# Patient Record
Sex: Female | Born: 2000 | Race: Black or African American | Hispanic: No | Marital: Single | Smoking: Never smoker
Health system: Southern US, Community
[De-identification: ages and names within clinical notes are randomized; demographics above are authoritative.]

## PROBLEM LIST (undated history)

## (undated) DIAGNOSIS — J45909 Unspecified asthma, uncomplicated: Secondary | ICD-10-CM

## (undated) DIAGNOSIS — A749 Chlamydial infection, unspecified: Secondary | ICD-10-CM

## (undated) HISTORY — DX: Chlamydial infection, unspecified: A74.9

## (undated) HISTORY — PX: WISDOM TOOTH EXTRACTION: SHX21

---
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
## 2018-07-22 ENCOUNTER — Ambulatory Visit: Payer: Medicaid Other | Admitting: Obstetrics & Gynecology

## 2019-04-04 ENCOUNTER — Ambulatory Visit (HOSPITAL_COMMUNITY)
Admission: EM | Admit: 2019-04-04 | Discharge: 2019-04-04 | Disposition: A | Discharge status: Home / Self Care | Payer: Medicaid Other | Attending: Family Medicine | Admitting: Family Medicine

## 2019-04-04 ENCOUNTER — Encounter (HOSPITAL_COMMUNITY): Payer: Self-pay

## 2019-04-04 ENCOUNTER — Other Ambulatory Visit: Payer: Self-pay

## 2019-04-04 DIAGNOSIS — R300 Dysuria: Secondary | ICD-10-CM

## 2019-04-04 LAB — POCT URINALYSIS DIP (DEVICE)
Bilirubin Urine: NEGATIVE
Glucose, UA: NEGATIVE mg/dL
Hgb urine dipstick: NEGATIVE
Ketones, ur: NEGATIVE mg/dL
Leukocytes,Ua: NEGATIVE
Nitrite: NEGATIVE
Protein, ur: NEGATIVE mg/dL
Specific Gravity, Urine: 1.025 (ref 1.005–1.030)
Urobilinogen, UA: 1 mg/dL (ref 0.0–1.0)
pH: 7 (ref 5.0–8.0)

## 2019-04-04 MED ORDER — CEPHALEXIN 500 MG PO CAPS: 500.0000 mg | ORAL_CAPSULE | Freq: Two times a day (BID) | ORAL | 0 refills | Status: DC

## 2019-04-04 NOTE — ED Provider Notes (Signed)
[place]    CSN: [id number] Arrival date & time: 04/04/19  1057     History   Chief Complaint Chief Complaint  Patient presents with  . Urinary Tract Infection    HPI Susan Moore is a 18 y.o. female.   HPI  Healthy 18 year old.  Here for dysuria and frequency.  She thinks she has a urinary tract infection.  She has had this before.  She also has a slight vaginal discharge.  No odor.  No itching.  She does not feel she has a vaginal infection.  We discussed STD testing she agrees.  She does have a single partner for the last 3 to 4 months.  They do not always use condoms.  She is using birth control.  Past Medical History:  Diagnosis Date  . Asthma     There are no active problems to display for this patient.   History reviewed. No pertinent surgical history.  OB History   No obstetric history on file.      Home Medications    Prior to Admission medications   Medication Sig Start Date End Date Taking? Authorizing Provider  albuterol (PROVENTIL HFA;VENTOLIN HFA) 108 (90 Base) MCG/ACT inhaler Inhale 1-2 puffs into the lungs every 6 (six) hours as needed for wheezing or shortness of breath.    [provider]  albuterol (PROVENTIL) (2.5 MG/3ML) 0.083% nebulizer solution Take 2.5 mg by nebulization every 6 (six) hours as needed for wheezing.    [provider]  cephALEXin (KEFLEX) 500 MG capsule Take 1 capsule (500 mg total) by mouth 2 (two) times daily. 04/04/19   Raylene Everts, MD  etonogestrel (IMPLANON) 68 MG IMPL implant 1 each by Subdermal route once.    [provider]    Family History History reviewed. No pertinent family history.  Social History Social History   Tobacco Use  . Smoking status: Passive Smoke Exposure - Never Smoker  . Smokeless tobacco: Never Used  Substance Use Topics  . Alcohol use: No  . Drug use: No     Allergies   Patient has no known allergies.   Review of Systems Review of  Systems  Constitutional: Negative for chills and fever.  HENT: Negative for ear pain and sore throat.   Eyes: Negative for pain and visual disturbance.  Respiratory: Negative for cough and shortness of breath.   Cardiovascular: Negative for chest pain and palpitations.  Gastrointestinal: Negative for abdominal pain and vomiting.  Genitourinary: Positive for dysuria, frequency and vaginal discharge. Negative for hematuria.  Musculoskeletal: Negative for arthralgias and back pain.  Skin: Negative for color change and rash.  Neurological: Negative for seizures and syncope.  All other systems reviewed and are negative.    Physical Exam Triage Vital Signs ED Triage Vitals  Enc Vitals Group     BP 04/04/19 1115 104/65     Pulse Rate 04/04/19 1115 77     Resp 04/04/19 1115 16     Temp 04/04/19 1115 98.9 F (37.2 C)     Temp Source 04/04/19 1115 Oral     SpO2 04/04/19 1115 100 %     Weight 04/04/19 1114 135 lb (61.2 kg)     Height --      Head Circumference --      Peak Flow --      Pain Score 04/04/19 1114 3     Pain Loc --      Pain Edu? --  Excl. in GC? --    No data found.  Updated Vital Signs BP 104/65 (BP Location: Right Arm)   Pulse 77   Temp 98.9 F (37.2 C) (Oral)   Resp 16   Wt 61.2 kg   LMP 03/18/2019   SpO2 100%   BMI 23.91 kg/m      Physical Exam Constitutional:      General: She is not in acute distress.    Appearance: She is well-developed.  HENT:     Head: Normocephalic and atraumatic.  Eyes:     Conjunctiva/sclera: Conjunctivae normal.     Pupils: Pupils are equal, round, and reactive to light.  Neck:     Musculoskeletal: Normal range of motion.  Cardiovascular:     Rate and Rhythm: Normal rate.  Pulmonary:     Effort: Pulmonary effort is normal. No respiratory distress.  Abdominal:     General: There is no distension.     Palpations: Abdomen is soft.  Genitourinary:    Comments: Exam deferred Musculoskeletal: Normal range of motion.   Skin:    General: Skin is warm and dry.  Neurological:     Mental Status: She is alert.      UC Treatments / Results  Labs (all labs ordered are listed, but only abnormal results are displayed) Labs Reviewed  URINE CULTURE  POCT URINALYSIS DIP (DEVICE)  CERVICOVAGINAL ANCILLARY ONLY    EKG None  Radiology No results found.  Procedures Procedures (including critical care time)  Medications Ordered in UC Medications - No data to display  Initial Impression / Assessment and Plan / UC Course  I have reviewed the triage vital signs and the nursing notes.  Pertinent labs & imaging results that were available during my care of the patient were reviewed by me and considered in my medical decision making (see chart for details).     We will treat for cystitis.  Test for STDs.  Discussed safe sex Final Clinical Impressions(s) / UC Diagnoses   Final diagnoses:  Dysuria     Discharge Instructions     Take the antibiotic 2 times a day Take 2 doses today Drink lots of water.  We did lab testing during this visit.  If there are any abnormal findings that require change in medicine or indicate a positive result, you will be notified.  If all of your tests are normal, you will not be called.       ED Prescriptions    Medication Sig Dispense Auth. Provider   cephALEXin (KEFLEX) 500 MG capsule Take 1 capsule (500 mg total) by mouth 2 (two) times daily. 10 capsule Raylene Everts, MD     Controlled Substance Prescriptions [place] Controlled Substance Registry consulted? Not Applicable   Raylene Everts, MD 04/04/19 1218

## 2019-04-04 NOTE — ED Notes (Signed)
Patient able to ambulate independently  

## 2019-04-04 NOTE — Discharge Instructions (Signed)
Take the antibiotic 2 times a day Take 2 doses today Drink lots of water.  We did lab testing during this visit.  If there are any abnormal findings that require change in medicine or indicate a positive result, you will be notified.  If all of your tests are normal, you will not be called.

## 2019-04-04 NOTE — ED Triage Notes (Signed)
Pt states she has pressure when voiding. Pt states it burn when she voids. This has been going on for a week.

## 2019-04-05 LAB — URINE CULTURE: Culture: NO GROWTH

## 2019-04-06 ENCOUNTER — Telehealth (HOSPITAL_COMMUNITY): Payer: Self-pay | Admitting: Emergency Medicine

## 2019-04-06 LAB — CERVICOVAGINAL ANCILLARY ONLY
Chlamydia: NEGATIVE
Neisseria Gonorrhea: POSITIVE — AB
Trichomonas: NEGATIVE

## 2019-04-06 NOTE — Telephone Encounter (Signed)
Gonorrhea is positive.  Patient should return as soon as possible to the urgent care for treatment with IM rocephin 250mg  and po zithromax 1g. Patient will not need to see a provider unless there are new symptoms she would like evaluated. Pt needs education to refrain from sexual intercourse for now and for 7 days after treatment to give the medicine time to work. Sexual partners need to be notified and tested/treated. Condoms may reduce risk of reinfection. [facility] notified.   Attempted to reach patient. No answer at this time. Voicemail left.

## 2019-04-08 ENCOUNTER — Telehealth (HOSPITAL_COMMUNITY): Payer: Self-pay | Admitting: Emergency Medicine

## 2019-04-08 ENCOUNTER — Ambulatory Visit (HOSPITAL_COMMUNITY)
Admission: EM | Admit: 2019-04-08 | Discharge: 2019-04-08 | Disposition: A | Discharge status: Home / Self Care | Payer: Self-pay | Attending: Family Medicine | Admitting: Family Medicine

## 2019-04-08 DIAGNOSIS — Z202 Contact with and (suspected) exposure to infections with a predominantly sexual mode of transmission: Secondary | ICD-10-CM

## 2019-04-08 DIAGNOSIS — Z23 Encounter for immunization: Secondary | ICD-10-CM

## 2019-04-08 MED ORDER — CEFTRIAXONE SODIUM 250 MG IJ SOLR
INTRAMUSCULAR | Status: AC
  Filled 2019-04-08: qty 250

## 2019-04-08 MED ORDER — AZITHROMYCIN 250 MG PO TABS
1000.0000 mg | ORAL_TABLET | Freq: Once | ORAL | Status: AC
  Administered 2019-04-08: 1000 mg via ORAL

## 2019-04-08 MED ORDER — AZITHROMYCIN 250 MG PO TABS
ORAL_TABLET | ORAL | Status: AC
  Filled 2019-04-08: qty 4

## 2019-04-08 MED ORDER — CEFTRIAXONE SODIUM 250 MG IJ SOLR
250.0000 mg | Freq: Once | INTRAMUSCULAR | Status: AC
  Administered 2019-04-08: 250 mg via INTRAMUSCULAR

## 2019-04-08 NOTE — Telephone Encounter (Signed)
Patient contacted and made aware of all results, all questions answered. Pt states she will return for treatment today.

## 2019-04-08 NOTE — ED Notes (Signed)
Pt here for std treatment.  

## 2019-09-10 ENCOUNTER — Inpatient Hospital Stay (HOSPITAL_COMMUNITY)
Admission: AD | Admit: 2019-09-10 | Discharge: 2019-09-10 | Disposition: A | Discharge status: Home / Self Care | Payer: Medicaid Other | Attending: Obstetrics and Gynecology | Admitting: Obstetrics and Gynecology

## 2019-09-10 ENCOUNTER — Inpatient Hospital Stay (HOSPITAL_COMMUNITY): Payer: Medicaid Other

## 2019-09-10 ENCOUNTER — Encounter (HOSPITAL_COMMUNITY): Payer: Self-pay | Admitting: *Deleted

## 2019-09-10 ENCOUNTER — Other Ambulatory Visit: Payer: Self-pay

## 2019-09-10 DIAGNOSIS — R109 Unspecified abdominal pain: Secondary | ICD-10-CM | POA: Insufficient documentation

## 2019-09-10 DIAGNOSIS — J45909 Unspecified asthma, uncomplicated: Secondary | ICD-10-CM | POA: Insufficient documentation

## 2019-09-10 DIAGNOSIS — O26899 Other specified pregnancy related conditions, unspecified trimester: Secondary | ICD-10-CM

## 2019-09-10 DIAGNOSIS — O3680X0 Pregnancy with inconclusive fetal viability, not applicable or unspecified: Secondary | ICD-10-CM

## 2019-09-10 DIAGNOSIS — Z3A01 Less than 8 weeks gestation of pregnancy: Secondary | ICD-10-CM | POA: Insufficient documentation

## 2019-09-10 DIAGNOSIS — O99511 Diseases of the respiratory system complicating pregnancy, first trimester: Secondary | ICD-10-CM | POA: Insufficient documentation

## 2019-09-10 DIAGNOSIS — Z79899 Other long term (current) drug therapy: Secondary | ICD-10-CM | POA: Insufficient documentation

## 2019-09-10 DIAGNOSIS — O26891 Other specified pregnancy related conditions, first trimester: Secondary | ICD-10-CM | POA: Insufficient documentation

## 2019-09-10 DIAGNOSIS — Z369 Encounter for antenatal screening, unspecified: Secondary | ICD-10-CM | POA: Insufficient documentation

## 2019-09-10 LAB — CBC
HCT: 39.5 % (ref 36.0–46.0)
Hemoglobin: 13.3 g/dL (ref 12.0–15.0)
MCH: 29.2 pg (ref 26.0–34.0)
MCHC: 33.7 g/dL (ref 30.0–36.0)
MCV: 86.8 fL (ref 80.0–100.0)
Platelets: 209 10*3/uL (ref 150–400)
RBC: 4.55 MIL/uL (ref 3.87–5.11)
RDW: 13.2 % (ref 11.5–15.5)
WBC: 6.3 10*3/uL (ref 4.0–10.5)
nRBC: 0 % (ref 0.0–0.2)

## 2019-09-10 LAB — WET PREP, GENITAL
Sperm: NONE SEEN
Trich, Wet Prep: NONE SEEN
Yeast Wet Prep HPF POC: NONE SEEN

## 2019-09-10 LAB — URINALYSIS, ROUTINE W REFLEX MICROSCOPIC
Bilirubin Urine: NEGATIVE
Glucose, UA: NEGATIVE mg/dL
Hgb urine dipstick: NEGATIVE
Ketones, ur: NEGATIVE mg/dL
Leukocytes,Ua: NEGATIVE
Nitrite: NEGATIVE
Protein, ur: NEGATIVE mg/dL
Specific Gravity, Urine: 1.015 (ref 1.005–1.030)
pH: 8 (ref 5.0–8.0)

## 2019-09-10 LAB — POC URINE PREG, ED: Preg Test, Ur: POSITIVE — AB

## 2019-09-10 LAB — HCG, QUANTITATIVE, PREGNANCY: hCG, Beta Chain, Quant, S: 2884 m[IU]/mL — ABNORMAL HIGH (ref <5)

## 2019-09-10 NOTE — ED Notes (Signed)
Transport called to accompany pt to MAU, MAU called and agrees with plan for transport

## 2019-09-10 NOTE — MAU Note (Signed)
Pt transferred from [facility] for +pregnancy test and abd pain. Pt reports she had had lower abd pain on and off since Monday. Pain radiates toward her back at time as well.  Denies any vag discharge or bleeding.

## 2019-09-10 NOTE — ED Triage Notes (Signed)
States she is a day late for her period and she is having abd. Pain , denies n/v. Denies vaginal discharge

## 2019-09-10 NOTE — Discharge Instructions (Signed)
Abdominal Pain During Pregnancy ° °Belly (abdominal) pain is common during pregnancy. There are many possible causes. Most of the time, it is not a serious problem. Other times, it can be a sign that something is wrong with the pregnancy. Always tell your doctor if you have belly pain. °Follow these instructions at home: °· Do not have sex or put anything in your vagina until your pain goes away completely. °· Get plenty of rest until your pain gets better. °· Drink enough fluid to keep your pee (urine) pale yellow. °· Take over-the-counter and prescription medicines only as told by your doctor. °· Keep all follow-up visits as told by your doctor. This is important. °Contact a doctor if: °· Your pain continues or gets worse after resting. °· You have lower belly pain that: °? Comes and goes at regular times. °? Spreads to your back. °? Feels like menstrual cramps. °· You have pain or burning when you pee (urinate). °Get help right away if: °· You have a fever or chills. °· You have vaginal bleeding. °· You are leaking fluid from your vagina. °· You are passing tissue from your vagina. °· You throw up (vomit) for more than 24 hours. °· You have watery poop (diarrhea) for more than 24 hours. °· Your baby is moving less than usual. °· You feel very weak or faint. °· You have shortness of breath. °· You have very bad pain in your upper belly. °Summary °· Belly (abdominal) pain is common during pregnancy. There are many possible causes. °· If you have belly pain during pregnancy, tell your doctor right away. °· Keep all follow-up visits as told by your doctor. This is important. °This information is not intended to replace advice given to you by your health care provider. Make sure you discuss any questions you have with your health care provider. °Document Released: 10/16/2009 Document Revised: 02/15/2019 Document Reviewed: 01/30/2017 °Elsevier Patient Education © 2020 [employer] ° °

## 2019-09-10 NOTE — MAU Provider Note (Signed)
[facility]     CSN: [id number]  Arrival date and time: 09/10/19 1343   First Provider Initiated Contact with Patient 09/10/19 1708      Chief Complaint  Patient presents with  . Abdominal Pain   18 y.o. G1 @[redacted]w[redacted]d  by sure LMP presenting with LAP. Reports onset 4 days ago. Describes as intermittent and cramping. Pain wraps around to back a times. Rates 4/10. Has not tried anything for it. Denies VB or discharge. No urinary sx. Hx of constipation but had BM this am.    OB History    Gravida  1   Para      Term      Preterm      AB      Living        SAB      TAB      Ectopic      Multiple      Live Births              Past Medical History:  Diagnosis Date  . Asthma     History reviewed. No pertinent surgical history.  No family history on file.  Social History   Tobacco Use  . Smoking status: Passive Smoke Exposure - Never Smoker  . Smokeless tobacco: Never Used  Substance Use Topics  . Alcohol use: No  . Drug use: No    Allergies: No Known Allergies  Medications Prior to Admission  Medication Sig Dispense Refill Last Dose  . albuterol (PROVENTIL HFA;VENTOLIN HFA) 108 (90 Base) MCG/ACT inhaler Inhale 1-2 puffs into the lungs every 6 (six) hours as needed for wheezing or shortness of breath.   Past Week at Unknown time  . albuterol (PROVENTIL) (2.5 MG/3ML) 0.083% nebulizer solution Take 2.5 mg by nebulization every 6 (six) hours as needed for wheezing.     . cephALEXin (KEFLEX) 500 MG capsule Take 1 capsule (500 mg total) by mouth 2 (two) times daily. 10 capsule 0   . etonogestrel (IMPLANON) 68 MG IMPL implant 1 each by Subdermal route once.       Review of Systems  Constitutional: Negative for chills and fever.  Gastrointestinal: Positive for abdominal pain and nausea. Negative for constipation, diarrhea and vomiting.  Genitourinary: Negative for dysuria, frequency, hematuria, vaginal bleeding and vaginal discharge.  Musculoskeletal: Positive  for back pain.   Physical Exam   Blood pressure 125/69, pulse 100, temperature 98.4 F (36.9 C), resp. rate 18, height 5\' 3"  (1.6 m), weight 59.9 kg, last menstrual period 08/12/2019, SpO2 100 %.  Physical Exam  Nursing note and vitals reviewed. Constitutional: She is oriented to person, place, and time. She appears well-developed and well-nourished. No distress.  HENT:  Head: Normocephalic and atraumatic.  Neck: Normal range of motion.  Respiratory: Effort normal. No respiratory distress.  GI: Soft. She exhibits no distension and no mass. There is no abdominal tenderness. There is no rebound and no guarding.  Genitourinary:    Genitourinary Comments: External: no lesions or erythema Vagina: rugated, pink, moist, scant thin white discharge Uterus: non enlarged, anteverted, non tender, no CMT Adnexae: no masses, no tenderness left, no tenderness right Cervix closed/long    Musculoskeletal: Normal range of motion.  Neurological: She is alert and oriented to person, place, and time.   Results for orders placed or performed during the hospital encounter of 09/10/19 (from the past 24 hour(s))  Urinalysis, Routine w reflex microscopic     Status: None   Collection Time: 09/10/19  2:25 PM  Result Value Ref Range   Color, Urine YELLOW YELLOW   APPearance CLEAR CLEAR   Specific Gravity, Urine 1.015 1.005 - 1.030   pH 8.0 5.0 - 8.0   Glucose, UA NEGATIVE NEGATIVE mg/dL   Hgb urine dipstick NEGATIVE NEGATIVE   Bilirubin Urine NEGATIVE NEGATIVE   Ketones, ur NEGATIVE NEGATIVE mg/dL   Protein, ur NEGATIVE NEGATIVE mg/dL   Nitrite NEGATIVE NEGATIVE   Leukocytes,Ua NEGATIVE NEGATIVE  POC Urine Pregnancy, ED (not at [facility])     Status: Abnormal   Collection Time: 09/10/19  2:51 PM  Result Value Ref Range   Preg Test, Ur POSITIVE (A) NEGATIVE  CBC     Status: None   Collection Time: 09/10/19  5:15 PM  Result Value Ref Range   WBC 6.3 4.0 - 10.5 K/uL   RBC 4.55 3.87 - 5.11 MIL/uL    Hemoglobin 13.3 12.0 - 15.0 g/dL   HCT 39.5 36.0 - 46.0 %   MCV 86.8 80.0 - 100.0 fL   MCH 29.2 26.0 - 34.0 pg   MCHC 33.7 30.0 - 36.0 g/dL   RDW 13.2 11.5 - 15.5 %   Platelets 209 150 - 400 K/uL   nRBC 0.0 0.0 - 0.2 %  hCG, quantitative, pregnancy     Status: Abnormal   Collection Time: 09/10/19  5:15 PM  Result Value Ref Range   hCG, Beta Chain, Quant, S 2,884 (H) <5 mIU/mL  Wet prep, genital     Status: Abnormal   Collection Time: 09/10/19  5:22 PM   Specimen: PATH Cytology Cervicovaginal Ancillary Only  Result Value Ref Range   Yeast Wet Prep HPF POC NONE SEEN NONE SEEN   Trich, Wet Prep NONE SEEN NONE SEEN   Clue Cells Wet Prep HPF POC PRESENT (A) NONE SEEN   WBC, Wet Prep HPF POC FEW (A) NONE SEEN   Sperm NONE SEEN    US Ob Less Than 14 Weeks With Ob Transvaginal  Result Date: 09/10/2019 CLINICAL DATA:  Abdominal pain EXAM: OBSTETRIC <14 WK [place] AND TRANSVAGINAL OB US TECHNIQUE: Both transabdominal and transvaginal ultrasound examinations were performed for complete evaluation of the gestation as well as the maternal uterus, adnexal regions, and pelvic cul-de-sac. Transvaginal technique was performed to assess early pregnancy. COMPARISON:  None. FINDINGS: LMP: 08/12/2019 GA by LMP: 4 w  1 d EDC by LMP: 05/18/2020 Intrauterine gestational sac: Single Yolk sac:  Not Visualized. Embryo:  Not Visualized. Cardiac Activity: Not Visualized. MSD: 3.4 mm   5 w   0 d Subchorionic hemorrhage:  None visualized. Maternal uterus/adnexae: Corpus luteum with peripheral color flow seen in the right ovary. Ovaries are otherwise unremarkable. No visible uterine abnormality. No free fluid. IMPRESSION: Single intrauterine gestational sac with an estimated gestational age of [redacted] weeks 0 days by mean sac diameter. Nonvisualization of the yolk sac or embryo may be related to early dates. Recommend serial beta HCG and short-term interval follow-up ultrasound in 7-10 days to assess for normal progression.  Electronically Signed   By: Lovena Le M.D.   On: 09/10/2019 18:02   MAU Course  Procedures  MDM Labs and [place] ordered and reviewed. IUGS seen but no YS or FP, likely this is an early pregnancy but cannot exclude ectopic pregnancy or failed pregnancy-discussed with pt. Will follow quant after 48 hrs. Pt has U[facility] scheduled at [facility] but would like f/u at [facility] to cancel UPT appt. Stable for discharge home.  Assessment and Plan  1. Pregnancy, location unknown   2. Abdominal pain affecting pregnancy    Discharge home Follow up at [facility] on 11/2 @11 :00 am SAB/ectopic precautions  Allergies as of 09/10/2019   No Known Allergies     Medication List    STOP taking these medications   cephALEXin 500 MG capsule Commonly known as: KEFLEX   Implanon 68 MG Impl implant Generic drug: etonogestrel     TAKE these medications   albuterol (2.5 MG/3ML) 0.083% nebulizer solution Commonly known as: PROVENTIL Take 2.5 mg by nebulization every 6 (six) hours as needed for wheezing.   albuterol 108 (90 Base) MCG/ACT inhaler Commonly known as: VENTOLIN HFA Inhale 1-2 puffs into the lungs every 6 (six) hours as needed for wheezing or shortness of breath.      Julianne Handler, CNM 09/10/2019, 6:23 PM

## 2019-09-10 NOTE — ED Notes (Signed)
Pt states that her period should have started 09/07/19, is now 3 days late.

## 2019-09-13 ENCOUNTER — Other Ambulatory Visit: Payer: Self-pay

## 2019-09-13 ENCOUNTER — Ambulatory Visit (INDEPENDENT_AMBULATORY_CARE_PROVIDER_SITE_OTHER): Payer: Self-pay | Admitting: Emergency Medicine

## 2019-09-13 DIAGNOSIS — O3680X0 Pregnancy with inconclusive fetal viability, not applicable or unspecified: Secondary | ICD-10-CM

## 2019-09-13 LAB — BETA HCG QUANT (REF LAB): hCG Quant: 6690 m[IU]/mL

## 2019-09-13 LAB — GC/CHLAMYDIA PROBE AMP (~~LOC~~) NOT AT ARMC
Chlamydia: NEGATIVE
Comment: NEGATIVE
Comment: NORMAL
Neisseria Gonorrhea: NEGATIVE

## 2019-09-13 NOTE — Progress Notes (Signed)
Pt here today for a stat beta lab draw. Pt denies having pain or bleeding. Pt informed that results could take at least 2 hours and once received they would be reviewed with a provider to establish a plan of care and she would be called. Pt was instructed to be seen in MAU for bleeding soaking a pad in an hour or less and severe pain. Pt verbalized understanding.   HCG result 6,690 today. Results reviewed with Rip Harbour, MD and orders were placed for ultrasound.   Pt called and informed of plan of care and given ultrasound date and time of 11/12 @2pm . Pt instructed to arrive 15 minutes prior with a full bladder. Pt verbalized understanding and had no further questions or concerns.

## 2019-09-13 NOTE — Progress Notes (Signed)
Agree with A & P. 

## 2019-09-23 ENCOUNTER — Ambulatory Visit (INDEPENDENT_AMBULATORY_CARE_PROVIDER_SITE_OTHER): Payer: Medicaid Other | Admitting: Lactation Services

## 2019-09-23 ENCOUNTER — Ambulatory Visit (HOSPITAL_COMMUNITY)
Admission: RE | Admit: 2019-09-23 | Discharge: 2019-09-23 | Disposition: A | Discharge status: Home / Self Care | Payer: Medicaid Other | Source: Ambulatory Visit | Attending: Obstetrics and Gynecology | Admitting: Obstetrics and Gynecology

## 2019-09-23 ENCOUNTER — Other Ambulatory Visit: Payer: Self-pay

## 2019-09-23 DIAGNOSIS — O3680X0 Pregnancy with inconclusive fetal viability, not applicable or unspecified: Secondary | ICD-10-CM | POA: Diagnosis not present

## 2019-09-23 DIAGNOSIS — Z3A01 Less than 8 weeks gestation of pregnancy: Secondary | ICD-10-CM | POA: Diagnosis not present

## 2019-09-23 DIAGNOSIS — Z3689 Encounter for other specified antenatal screening: Secondary | ICD-10-CM | POA: Diagnosis not present

## 2019-09-23 DIAGNOSIS — Z3401 Encounter for supervision of normal first pregnancy, first trimester: Secondary | ICD-10-CM

## 2019-09-23 MED ORDER — PRENATAL PLUS 27-1 MG PO TABS: 1.0000 | ORAL_TABLET | Freq: Every day | ORAL | 11 refills | Status: DC

## 2019-09-23 NOTE — Progress Notes (Signed)
Pt here for [place] results. EDD 05/15/2020 via [place]. Pt is unsure of LMP.   Pt denies pain, cramping or spotting.   Pt is not taking PNV, PNV sent to pharmacy.   List of approved OTC meds given.   Pt to follow up Nurse intake and provider visit.   Pt informed of when to seek emergency care at the [facility] and [place] if needed.

## 2019-09-26 NOTE — Progress Notes (Signed)
I agree with the nurses note and plan of care.   Lezlie Lye, NP 09/26/2019 12:57 AM

## 2019-10-26 ENCOUNTER — Ambulatory Visit (INDEPENDENT_AMBULATORY_CARE_PROVIDER_SITE_OTHER): Payer: Medicaid Other | Admitting: *Deleted

## 2019-10-26 ENCOUNTER — Other Ambulatory Visit: Payer: Self-pay

## 2019-10-26 DIAGNOSIS — Z3490 Encounter for supervision of normal pregnancy, unspecified, unspecified trimester: Secondary | ICD-10-CM

## 2019-10-26 DIAGNOSIS — O0990 Supervision of high risk pregnancy, unspecified, unspecified trimester: Secondary | ICD-10-CM | POA: Insufficient documentation

## 2019-10-26 MED ORDER — BLOOD PRESSURE KIT DEVI: 1.0000 | 0 refills | Status: DC | PRN

## 2019-10-26 NOTE — Patient Instructions (Signed)

## 2019-10-26 NOTE — Progress Notes (Signed)
Explained I am completing her New OB Intake today. We discussed Her EDD and that it is based on  sure LMP . I reviewed her allergies, meds, OB History, Medical /Surgical history, and appropriate screenings. I explained I will send her the [employer] app- app sent to her while on phone.  I explained we will send a blood pressure cuff to [facility] that will fill that prescription and they  will call her to verify her information. I asked her to bring the blood pressure cuff with her to her first ob appointment so we can show her how to use it. Explained  then we will have her take her blood pressure weekly and enter into the app. Explained she will have some visits in office and some virtually.We discussed her new ob will be virtual and she would like to come in soon for ob labwork including panorama. I sent her  MyChart text and she signed up for Mychart and downloaded the MyChart app. I reviewed her appointment date/ time with her , our location and to wear mask, no visitors. Explained she will have virtual tomorrow and then at first ob visit in office will have exam and will be called with appointment for  ob bloodwork, hemoglobin a1C, cbg , genetic testing if desired, I scheduled an [place] at 19 weeks and gave her the appointment. She voices understanding.

## 2019-10-26 NOTE — Progress Notes (Signed)
I connected with  [name] on 10/26/19 at  9:30 AM EST by telephone and verified that I am speaking with the correct person using two identifiers.   I discussed the limitations, risks, security and privacy concerns of performing an evaluation and management service by telephone and the availability of in person appointments. I also discussed with the patient that there may be a patient responsible charge related to this service. The patient expressed understanding and agreed to proceed. Dating Criteria: Methods for Estimating the Due Date    Lakesha Levinson,RN 10/26/2019  9:31 AM

## 2019-10-26 NOTE — Progress Notes (Signed)
Patient seen and assessed by nursing staff during this encounter. I have reviewed the chart and agree with the documentation and plan.  Kerry Hough, PA-C 10/26/2019 11:16 AM

## 2019-10-27 ENCOUNTER — Other Ambulatory Visit (HOSPITAL_COMMUNITY)
Admission: RE | Admit: 2019-10-27 | Discharge: 2019-10-27 | Disposition: A | Discharge status: Home / Self Care | Payer: Medicaid Other | Source: Ambulatory Visit | Attending: Medical | Admitting: Medical

## 2019-10-27 ENCOUNTER — Encounter: Payer: Self-pay | Admitting: *Deleted

## 2019-10-27 ENCOUNTER — Ambulatory Visit (INDEPENDENT_AMBULATORY_CARE_PROVIDER_SITE_OTHER): Payer: Medicaid Other | Admitting: Medical

## 2019-10-27 VITALS — BP 111/69 | HR 91 | Wt 134.6 lb

## 2019-10-27 DIAGNOSIS — J4520 Mild intermittent asthma, uncomplicated: Secondary | ICD-10-CM | POA: Diagnosis not present

## 2019-10-27 DIAGNOSIS — J45909 Unspecified asthma, uncomplicated: Secondary | ICD-10-CM | POA: Insufficient documentation

## 2019-10-27 DIAGNOSIS — Z23 Encounter for immunization: Secondary | ICD-10-CM

## 2019-10-27 DIAGNOSIS — Z3A10 10 weeks gestation of pregnancy: Secondary | ICD-10-CM | POA: Diagnosis not present

## 2019-10-27 DIAGNOSIS — Z3490 Encounter for supervision of normal pregnancy, unspecified, unspecified trimester: Secondary | ICD-10-CM

## 2019-10-27 DIAGNOSIS — Z315 Encounter for genetic counseling: Secondary | ICD-10-CM | POA: Diagnosis not present

## 2019-10-27 DIAGNOSIS — O26891 Other specified pregnancy related conditions, first trimester: Secondary | ICD-10-CM | POA: Diagnosis not present

## 2019-10-27 DIAGNOSIS — Z3481 Encounter for supervision of other normal pregnancy, first trimester: Secondary | ICD-10-CM | POA: Diagnosis not present

## 2019-10-27 NOTE — Patient Instructions (Addendum)
Safe Medications in Pregnancy   Acne: Benzoyl Peroxide Salicylic Acid  Backache/Headache: Tylenol: 2 regular strength every 4 hours OR              2 Extra strength every 6 hours  Colds/Coughs/Allergies: Benadryl (alcohol free) 25 mg every 6 hours as needed Breath right strips Claritin Cepacol throat lozenges Chloraseptic throat spray Cold-Eeze- up to three times per day Cough drops, alcohol free Flonase (by prescription only) Guaifenesin Mucinex Robitussin DM (plain only, alcohol free) Saline nasal spray/drops Sudafed (pseudoephedrine) & Actifed ** use only after [redacted] weeks gestation and if you do not have high blood pressure Tylenol Vicks Vaporub Zinc lozenges Zyrtec   Constipation: Colace Ducolax suppositories Fleet enema Glycerin suppositories Metamucil Milk of magnesia Miralax Senokot Smooth move tea  Diarrhea: Kaopectate Imodium A-D  *NO pepto Bismol  Hemorrhoids: Anusol Anusol HC Preparation H Tucks  Indigestion: Tums Maalox Mylanta Zantac  Pepcid  Insomnia: Benadryl (alcohol free) 25mg every 6 hours as needed Tylenol PM Unisom, no Gelcaps  Leg Cramps: Tums MagGel  Nausea/Vomiting:  Bonine Dramamine Emetrol Ginger extract Sea bands Meclizine  Nausea medication to take during pregnancy:  Unisom (doxylamine succinate 25 mg tablets) Take one tablet daily at bedtime. If symptoms are not adequately controlled, the dose can be increased to a maximum recommended dose of two tablets daily (1/2 tablet in the morning, 1/2 tablet mid-afternoon and one at bedtime). Vitamin B6 100mg tablets. Take one tablet twice a day (up to 200 mg per day).  Skin Rashes: Aveeno products Benadryl cream or 25mg every 6 hours as needed Calamine Lotion 1% cortisone cream  Yeast infection: Gyne-lotrimin 7 Monistat 7   **If taking multiple medications, please check labels to avoid duplicating the same active ingredients **take medication as directed on  the label ** Do not exceed 4000 mg of tylenol in 24 hours **Do not take medications that contain aspirin or ibuprofen   Childbirth Education Options: [facility] Classes:  Childbirth education classes can help you get ready for a positive parenting experience. You can also meet other expectant parents and get free stuff for your baby. Each class runs for five weeks on the same night and costs $45 for the mother-to-be and her support person. Medicaid covers the cost if you are eligible. Call [phone] to register. [facility] Childbirth Education:  [phone] or [phone] or [email or web address]  Baby & Me Class: Discuss newborn & infant parenting and family adjustment issues with other new mothers in a relaxed environment. Each week brings a new speaker or baby-centered activity. We encourage new mothers to join us every Thursday at 11:00am. Babies birth until crawling. No registration or fee. Daddy Boot Camp: This course offers Dads-to-be the tools and knowledge needed to feel confident on their journey to becoming new fathers. Experienced dads, who have been trained as coaches, teach dads-to-be how to hold, comfort, diaper, swaddle and play with their infant while being able to support the new mom as well. A class for men taught by men. $25/dad Big Brother/Big Sister: Let your children share in the joy of a new brother or sister in this special class designed just for them. Class includes discussion about how families care for babies: swaddling, holding, diapering, safety as well as how they can be helpful in their new role. This class is designed for children ages 2 to 6, but any age is welcome. Please register each child individually. $5/child  Mom Talk: This mom-led group offers support and connection to   mothers as they journey through the adjustments and struggles of that sometimes overwhelming first year after the birth of a child. Tuesdays at 10:00am and  Thursdays at 6:00pm. Babies welcome. No registration or fee. Breastfeeding Support Group: This group is a mother-to-mother support circle where moms have the opportunity to share their breastfeeding experiences. A Lactation Consultant is present for questions and concerns. Meets each Tuesday at 11:00am. No fee or registration. Breastfeeding Your Baby: Learn what to expect in the first days of breastfeeding your newborn.  This class will help you feel more confident with the skills needed to begin your breastfeeding experience. Many new mothers are concerned about breastfeeding after leaving the hospital. This class will also address the most common fears and challenges about breastfeeding during the first few weeks, months and beyond. (call for fee) Comfort Techniques and Tour: This 2 hour interactive class will provide you the opportunity to learn & practice hands-on techniques that can help relieve some of the discomfort of labor and encourage your baby to rotate toward the best position for birth. You and your partner will be able to try a variety of labor positions with birth balls and rebozos as well as practice breathing, relaxation, and visualization techniques. A tour of the [facility] is included with this class. $20 per registrant and support person Childbirth Class- Weekend Option: This class is a Weekend version of our Birth & Baby series. It is designed for parents who have a difficult time fitting several weeks of classes into their schedule. It covers the care of your newborn and the basics of labor and childbirth. It also includes a Maternity Care Center Tour of [facility] and lunch. The class is held two consecutive days: beginning on Friday evening from 6:30 - 8:30 p.m. and the next day, Saturday from 9 a.m. - 4 p.m. (call for fee) Waterbirth Class: Interested in a waterbirth?  This informational class will help you discover whether waterbirth is the right fit  for you. Education about waterbirth itself, supplies you would need and how to assemble your support team is what you can expect from this class. Some obstetrical practices require this class in order to pursue a waterbirth. (Not all obstetrical practices offer waterbirth-check with your healthcare provider.) Register only the expectant mom, but you are encouraged to bring your partner to class! Required if planning waterbirth, no fee. Infant/Child CPR: Parents, grandparents, babysitters, and friends learn Cardio-Pulmonary Resuscitation skills for infants and children. You will also learn how to treat both conscious and unconscious choking in infants and children. This Family & Friends program does not offer certification. Register each participant individually to ensure that enough mannequins are available. (Call for fee) Grandparent Love: Expecting a grandbaby? This class is for you! Learn about the latest infant care and safety recommendations and ways to support your own child as he or she transitions into the parenting role. Taught by Registered Nurses who are childbirth instructors, but most importantly...they are grandmothers too! $10/person. Childbirth Class- Natural Childbirth: This series of 5 weekly classes is for expectant parents who want to learn and practice natural methods of coping with the process of labor and childbirth. Relaxation, breathing, massage, visualization, role of the partner, and helpful positioning are highlighted. Participants learn how to be confident in their body's ability to give birth. This class will empower and help parents make informed decisions about their own care. Includes discussion that will help new parents transition into the immediate postpartum period. Maternity   want to learn and practice natural methods of coping with the process of labor and childbirth. Relaxation, breathing, massage, visualization, role of the partner, and helpful positioning are highlighted. Participants learn how to be confident in their body's ability to give birth. This class will empower and help parents make informed decisions about their own care. Includes discussion that will help new parents transition into the immediate postpartum period. [facility] is included. We suggest taking this class between 25-32 weeks, but  it's only a recommendation. $75 per registrant and one support person or $30 Medicaid. Childbirth Class- 3 week Series: This option of 3 weekly classes helps you and your labor partner prepare for childbirth. Newborn care, labor & birth, cesarean birth, pain management, and comfort techniques are discussed and a [place] [facility] is included. The class meets at the same time, on the same day of the week for 3 consecutive weeks beginning with the starting date you choose. $60 for registrant and one support person.  Marvelous Multiples: Expecting twins, triplets, or more? This class covers the differences in labor, birth, parenting, and breastfeeding issues that face multiples' parents. NICU tour is included. Led by a Certified Childbirth Educator who is the mother of twins. No fee. Caring for Baby: This class is for expectant and adoptive parents who want to learn and practice the most up-to-date newborn care for their babies. Focus is on birth through the first six weeks of life. Topics include feeding, bathing, diapering, crying, umbilical cord care, circumcision care and safe sleep. Parents learn to recognize symptoms of illness and when to call the pediatrician. Register only the mom-to-be and your partner or support person can plan to come with you! $10 per registrant and support person Childbirth Class- online option: This online class offers you the freedom to complete a Birth and Baby series in the comfort of your own home. The flexibility of this option allows you to review sections at your own pace, at times convenient to you and your support people. It includes additional video information, animations, quizzes, and extended activities. Get organized with helpful eClass tools, checklists, and trackers. Once you register online for the class, you will receive an email within a few days to accept the invitation and begin the class when the time is right for you. The content  will be available to you for 60 days. $60 for 60 days of online access for you and your support people.  Local Doulas: Natural Baby Doulas [email or web address] Tel: [phone] [email or web address]/ [employer] [phone] [email or web address] .[email or web address] www.piedmontdoulas.com The Labor Hassell Halim  (also do waterbirth tub rental) [phone] [email or web address] https://www.[email or web address]/ Triad Birth Doula [phone] [email or web address] NotebookDistributors.fi Sacred Rhythms  [phone] https://sacred-rhythms.com/ [employer] (PADA) pada.[email or web address] https://www.frey.org/ [place] Birth and Baby  http://[email or web address]/ Considering Waterbirth? Guide for patients at [facility] [employer]  Why consider waterbirth?  . Gentle birth for babies . Less pain medicine used in labor . May allow for passive descent/less pushing . May reduce perineal tears  . More mobility and instinctive maternal position changes . Increased maternal relaxation . Reduced blood pressure in labor  Is waterbirth safe? What are the risks of infection, drowning or other complications?  . Infection: o Very low risk (3.7 % for tub vs 4.8% for bed) o 7 in 8000 waterbirths with documented infection o Poorly cleaned equipment most common cause o Slightly lower group B strep transmission rate  . Drowning o Maternal:  - Very low risk   -  Related to seizures or fainting o Newborn:  - Very low risk. No evidence of increased risk of respiratory problems in multiple large studies - Physiological protection from breathing under water - Avoid underwater birth if there are any fetal complications - Once baby's head is out of the water, keep it out.  . Birth complication o Some reports of cord trauma, but risk decreased by bringing baby to surface gradually o No evidence of increased risk of shoulder  dystocia. Mothers can usually change positions faster in water than in a bed, possibly aiding the maneuvers to free the shoulder.   You must attend a Doren Custard class at [facility]  3rd Wednesday of every month from 7-9pm  [employer] by calling [phone] or online at [email or web address]  Bring [place] the certificate from the class to your prenatal appointment  Meet with a midwife at 36 weeks to see if you can still plan a waterbirth and to sign the consent.   Purchase or rent the following supplies:   Water Birth Pool (Birth Pool in a Box or [place] for instance)  (Tubs start ~$125)  Single-use disposable tub liner designed for your brand of tub  New garden hose labeled "lead-free", "suitable for drinking water",  Electric drain pump to remove water (We recommend 792 gallon per hour or greater pump.)   Separate garden hose to remove the dirty water  Fish net  Bathing suit top (optional)  Long-handled mirror (optional)  Places to purchase or rent supplies  GotWebTools.is for tub purchases and supplies  Waterbirthsolutions.com for tub purchases and supplies  The Labor Ladies (www.thelaborladies.com) $275 for tub rental/set-up & take down/kit   [employer] (http://www.fleming.com/.htm) Information regarding doulas (labor support) who provide pool rentals  Our practice has a Birth Pool in a Box tub at the hospital that you may borrow on a first-come-first-served basis. It is your responsibility to to set up, clean and break down the tub. We cannot guarantee the availability of this tub in advance. You are responsible for bringing all accessories listed above. If you do not have all necessary supplies you cannot have a waterbirth.    Things that would prevent you from having a waterbirth:  Premature, <37wks  Previous cesarean birth  Presence of thick meconium-stained fluid  Multiple gestation (Twins, triplets,  etc.)  Uncontrolled diabetes or gestational diabetes requiring medication  Hypertension requiring medication or diagnosis of pre-eclampsia  Heavy vaginal bleeding  Non-reassuring fetal heart rate  Active infection (MRSA, etc.). Group B Strep is NOT a contraindication for  waterbirth.  If your labor has to be induced and induction method requires continuous  monitoring of the baby's heart rate  Other risks/issues identified by your obstetrical provider  Please remember that birth is unpredictable. Under certain unforeseeable circumstances your provider may advise against giving birth in the tub. These decisions will be made on a case-by-case basis and with the safety of you and your baby as our highest priority.   AREA PEDIATRIC/FAMILY PRACTICE PHYSICIANS  [place] [phone]) . [facility] Davy Pique, MD; Gwendlyn Deutscher, MD; Walker Kehr, MD; Andria Frames, MD; McDiarmid, MD; Dutch Quint, MD; Nori Riis, MD; [place]ingo Amber, [address] [place], [place] [address] o [phone] o Mon-Fri 8:30-12:30, 1:30-5:00 o Providers come to see babies at [facility] o Accepting Medicaid . [place] [facility] providers who accept newborns: Dorthy Cooler, MD; Orland Mustard, MD; Stephanie Acre, MD o [place], [place], [place] [address] o [phone]  o Mon-Fri 8:00-5:30 o Babies seen by providers at [facility] o Does NOT accept Medicaid o Please call early in hospitalization for appointment (limited availability)  . Mustard Rafter J Ranch, MD o [address], [place], [place] [address] o [phone] o Mon, Tue, Thur, Fri 8:30-5:00, Wed 10:00-7:00 (closed 1-2pm) o Babies seen by [facility] providers o Accepting Medicaid . Jerome, MD o [place], [place], [place] [address] o [phone] o Mon-Fri 8:30-5:00, Sat 8:30-12:00 o Provider comes to see babies at [place] Medicaid o Must have been referred from current patients or contacted office prior to delivery . [place] ([place] for [place]) Franne Forts, MD; Tamera Punt, MD; Doneen Poisson, MD; Fatima Sanger, MD; Wynetta Emery, MD; Jess Barters, MD; Tami Ribas, MD; Herbert Moors, MD; Derrell Lolling, MD; Dorothyann Peng, MD; Lucious Groves, NP; Baldo Ash, NP o [address], [place], [place] [address] o [phone] o Mon, Tue, Thur, Fri 8:30-5:30, Wed 9:30-5:30, Sat 8:30-12:30 o Babies seen by [facility] providers o Accepting Medicaid o Only accepting infants of first-time parents or siblings of current patients [facility] discharge coordinator will make follow-up appointment . Baltazar Najjar o [place] [address], [place], [place]  [address] o [phone]   Fax - [phone] . [facility] o [address], [place], [place]  [address] o Phone - [phone]   Fax [phone] . [place], [place], [place], [place]  [address] o [phone]  [place] [phone]) . [facility] Reginal Lutes, MD; Jacklynn Ganong, MD; Torrie Mayers, MD; MD; Rosana Hoes, MD; Servando Salina, MD; Rose Fillers, MD; Rex Kras, MD; Corinna Capra, MD; Volney American, MD; Trilby Drummer, MD; Janann Colonel, MD; Jimmye Norman, [place], [place], [place] [address] o [phone] o Mon-Fri 8:30-5:00 (extended evenings Mon-Thur as needed), Sat-Sun 10:00-1:00 o Providers come to see babies at [place] Medicaid for families of first-time babies and families with all children in the household age 74 and under. Must register with office prior to making appointment (M-F only). . [place], NP; Tomi Bamberger, MD; Redmond School, MD; [place], [place] [address] [place], [place] [address] o [phone] o Mon-Fri 8:00-5:00 o Babies seen by providers at [facility] o Does NOT accept Medicaid/Commercial Insurance Only . [facility] at [place]  ([facility])  Marnee Guarneri, MD; Drema Dallas, MD; Montine Circle, MD; Vilma Prader, MD; Vanita Panda, MD; Alfonso Ramus, MD; Ruthann Cancer, MD; Roxanne Mins, MD; Rosalva Ferron, MD; Polly Cobia, MD o [address] [place], [place] [address] o [phone] o Mon-Fri 8:30-5:30, Sat (Oct.-Mar.) 9:00-1:00 o Babies seen by providers at [place] [phone]) . [facility] Peers, MD; Suzan Slick, MD o [address], [place], [place] [address] o [phone] o Mon-Fri 8:30-5:00, Sat 8:30-12:00 o Providers come to see babies at [facility] o Does NOT accept Medicaid . [place] at [place], [place]; [place], MD; [place], [place]; Nancy Fetter, MD; Moreen Fowler, [place], [place], [place] [address] o [phone] o Mon-Fri 8:00-5:00 o Babies seen by providers at [facility] o Does NOT accept Medicaid o Only accepting babies of parents who are patients o Please call early in hospitalization for appointment (limited availability) . [facility] Pediatricians Blanca Friend, MD; Sharlene Motts, MD; Rod Can, MD; Warner Mccreedy, NP; Sabra Heck, MD; Ermalinda Memos, MD; Sharlett Iles, NP; Aurther Loft, MD; Jerrye Beavers, MD; Marcello Moores, MD; Berline Lopes, MD; Charolette Forward, MD o  [place], [place], [place] [address] o [phone] o Mon-Fri 8:00-5:00, Sat 9:00-12:00 o Providers come to see babies at [facility] o Does NOT accept [facility] [phone]) . [facility] providers accepting new patients: Dayna Ramus, NP; [place], [place], [place], [place] [address] o [phone] o Mon-Fri 8:00-5:00 o Babies seen by providers at [facility] o Does NOT accept Medicaid o Only accepting babies of parents who are patients o Please call early in hospitalization for appointment (limited availability) . [facility] Oswaldo Conroy, MD; Sheran Lawless, MD o [address] [place], [place] [address] o [phone] (press 1 to schedule  appointment) o Mon-Fri 8:00-5:00 o Providers come to see babies at [facility] o Does NOT accept Medicaid . [facility] Jodi Mourning, MD o [address], [place], [place] [address] o [phone] o Mon-Fri 8:30-5:00 (lunch 12:30-1:00), extended hours by appointment only Wed 5:00-6:30 o Babies seen by [facility] providers o Accepting Medicaid . [place] at Evalyn Casco, MD; [place], MD; Ethlyn Gallery, MD o [place], [place], [place] [address] o [phone] o Mon-Fri 8:00-5:00 o Babies seen by [facility] providers o Does NOT accept Medicaid . Therapist, music at [place], MD; Yong Channel, MD; [place], [place] [address] [place], [place] [address] o [phone] o Mon-Fri 8:00-5:00 o Babies seen by [facility] providers o Does NOT accept Medicaid . [place], [place]; [place], [place]; [place], NP; Albertina Parr, MD; Frederic Jericho, MD; Ronney Lion, MD; Carlos Levering, NP; Jerelene Redden, NP; Tomasita Crumble, NP; Ronelle Nigh, NP; Corinna Lines, MD; McFarland, MD o [address] [place], [place] [address] o [phone] o Mon-Fri 8:30-5:00, Sat 10:00-1:00 o Providers come to see babies at [facility] o Does NOT accept Medicaid o Free prenatal information session Tuesdays at 4:45pm . [facility] Porfirio Oar, MD; [place], [place]; [place], [place]; [place], [place] [place] [address] o [phone] o Mon-Fri 7:30-5:30 o Babies seen by [facility] providers . [facility] o [address] [place], [place], [place]  [address] o [phone]   Fax - [phone]  [place] [phone] & [phone]) . [place] o [address] [place], [place] [address] o [phone] o Mon-Thur 8:00-6:00 o Providers come to see babies at [place] Medicaid . Lucas, NP; Melford Aase, MD; [place], [place]; [place], [address] [place], [place] [address] o [phone] o Mon-Thur 7:30-7:30, Fri 7:30-4:30 o Babies seen by [facility] providers o Accepting Medicaid . [facility] Nyra Jabs, MD; Cristino Martes, NP; Gertie Baron, MD o [place] [address], [place], [place] [address] o [phone] o Mon-Fri 8:30-5:00, Sat 8:30-12:00 o Providers come to see babies at [place] Medicaid o Must have "Meet & Greet" appointment at office prior to delivery . [place] ([place]) Jodene Nam, MD; Juleen China, MD; Clydene Laming, [place] [address], [place], [place] [address] o [phone] o Mon-Wed 8:00-6:00, Thur-Fri 8:00-5:00, Sat 9:00-12:00 o Providers come to see babies at [facility] o Does NOT accept Medicaid o Only accepting siblings of current patients . [facility] [place], [place], [place], [place]  [address] o [phone]   Fax [phone] . [place] at [place] [address], [place],   [address] o  [phone]   Fax - [place] [phone] & [phone]) . Therapist, music at [place], DO; [place], [address] [place], [place] [address] o [phone] o Mon-Fri 7:00-5:00 o Babies seen by [facility] providers o Does NOT accept Medicaid . Archer, MD; [place], [place]; [place], [place], [place], [place] [address] o [phone] o Mon-Fri 8:00-5:00 o Babies seen by [facility] providers o Accepting Medicaid . Duncan, MD; [place], [place]; [place], NP; [place], [place], [place], [place] [address] o [phone] o Mon-Fri 8:00-5:00 o Babies seen by providers at [place] [phone]) . [facility] at [place], [place] o [address] [place], [place] [address] o [phone] o Mon-Fri 8:00-5:00 o Babies seen by [facility] providers o Does NOT accept Medicaid o Limited availability, please call early in hospitalization to schedule follow-up . [facility] Leilani Merl, PA; Maisie Fus, MD; [place], MD; [place], [place]; Jeannine Kitten, MD; [place], [facility] [address], [place], [place] [address] o [phone] o Mon-Fri 8:30-5:00, Sat 9:00-12:00 o Babies seen by providers at [facility] o Accepting Medicaid o Please register online then schedule online or call office o [email or web address] . [place] ([facility] [place]) Kristian Covey, NP; Dwyane Dee, MD; Leonidas Romberg, PA o [address] [place], [place], [place] [address] o [phone] o Mon-Fri 8:00-5:00 o Babies seen by providers at [facility] o Accepting Medicaid . [place] ([facility] [employer]) Dairl Ponder, MD; Rayvon Char, NP; Melina Modena, MD o [address] [place], [place], [place] [address] o [phone] o Mon-Fri 8:00-5:30, Sat&Sun by appointment (phones open at 8:30) o Babies seen by [facility] providers o Accepting Medicaid o Must be a first-time baby or sibling of current patient . [address], [address], [place], [place]  [address] o [phone]   Fax - [phone]  [place] [phone] & [phone]) . [place], [place]; [place], [place]; Benjamine Mola, MD; [place], [place]; Harrell Lark, MD o [address], [place], [place] [address] o [phone] o Mon-Thur 8:00-7:00, Fri 8:00-5:00, Sat 8:00-12:00, Sun 9:00-12:00 o Babies seen by [facility] providers o Accepting Medicaid . [facility], MD; Ruthann Cancer, MD; [facility], MD o [address], [place],   [address] o [phone] o Mon-Thur 8:00-5:00 o Babies seen by providers at [facility] o Accepting Medicaid . [facility] [place], MD; Coe[place]Goins, MD; Amedeo Plenty, MD; Bobby Rumpf, MD; List, MD; Lavonia Drafts, MD; Ruthann Cancer, MD; Selinda Eon, MD; Audie Box, MD; Jim Like, MD; Christie Nottingham, MD; Hubbard Hartshorn, MD; Modena Nunnery, MD o [address] [place], [place] [address] o [phone] o Mon-Fri 8:00-5:30, Sat (Oct.-Mar.) 9:00-1:00 o Babies seen by providers at [facility] o Accepting Medicaid o Must fill out new patient packet, available online at [email or web address] . [place] ([facility]) Barnabas Lister, NP; Kenton Kingfisher, NP; Claiborne Billings, NP; Rolla Plate, MD; [place], [place]; Carola Rhine, MD; Tyron Russell, MD; Delia Chimes, NP o [address], [place],  [address] o [phone] o Mon-Thur 8:00-5:30, Fri 8:00-5:00 o Babies seen by providers at [facility] o  Accepting Medicaid  [employer] [phone]) . [place], [place]; [place], MD; Dennard Schaumann, MD; [place], [place] o [address] [place], [place] [address] o [phone] o Mon-Fri 8:00-5:00 o Babies seen by providers at [place] [phone]) . [place] [facility] [place], [place]; Olen Pel, MD; [place], [place], [place], [place] [address] o [phone] o Mon-Fri 8:00-5:00 o Babies seen by providers at [facility] o Does NOT accept Medicaid o Limited appointment availability, please call early in hospitalization  . Therapist, music at [place], [place]; [place], [place] [address], [place], [place] [address] o [phone] o Mon-Fri 8:00-5:00 o Babies seen by [facility] providers o Does NOT accept Medicaid . [facility] Su [place], MD; Guy Sandifer, MD; [place], [place]; [place], [address], [place], [place]  [address] o [phone] o Mon-Fri 8:00-5:00 o After hours clinic [id number] [place], [place] [address]) [phone] Mon-Fri 5:00-8:00, Sat 12:00-6:00, Sun 10:00-4:00 o Babies seen by [facility] providers o Accepting Medicaid . [place] at [facility] o [address] [place], [place]  [address] o [phone]   Fax - [phone]  [place] [phone]) . Therapist, music at [facility], MD o [address] [place], [place], [place] [address] o [phone] o Mon-Fri 8:00-5:00 o Babies seen by [facility] providers o Does NOT accept Medicaid . [place] ([place] [facility] [place]) Bing Neighbors, MD o [address] [place], [place], [place] [address] o [phone] o Mon-Thur 8:00-7:00, Fri 8:00-5:00, Sat 8:00-12:00 o Babies seen by providers at [facility] o Accepting Medicaid - but does not have vaccinations in office (must be received elsewhere) o Limited availability, please call early in hospitalization  [place] ([phone]) . [place], [place] [address] [place] [address] o [phone]  Fax [phone]

## 2019-10-27 NOTE — Progress Notes (Signed)
   PRENATAL VISIT NOTE  Subjective:  Susan Moore is a 18 y.o. G1P0 at [redacted]w[redacted]d being seen today for her first prenatal visit for this pregnancy.  She is currently monitored for the following issues for this low-risk pregnancy and has Supervision of low-risk pregnancy and Asthma on their problem list.  Patient reports no complaints.  Contractions: Not present. Vag. Bleeding: None.  Movement: Absent. Denies leaking of fluid.   She is planning to breastfeed. Desires contraception, unsure of type.   The following portions of the patient's history were reviewed and updated as appropriate: allergies, current medications, past family history, past medical history, past social history, past surgical history and problem list.   Objective:   Vitals:   10/27/19 0957  BP: 111/69  Pulse: 91  Weight: 134 lb 9.6 oz (61.1 kg)    Fetal Status: Fetal Heart Rate (bpm): 168   Movement: Absent     General:  Alert, oriented and cooperative. Patient is in no acute distress.  Skin: Skin is warm and dry. No rash noted.   Cardiovascular: Normal heart rate and rhythm noted  Respiratory: Normal respiratory effort, no problems with respiration noted. Clear to auscultation.   Abdomen: Soft, gravid, appropriate for gestational age. Normal bowel sounds. Non-tender. Pain/Pressure: Present     Pelvic: Cervical exam deferred        Extremities: Normal range of motion.  Edema: None  Mental Status: Normal mood and affect. Normal behavior. Normal judgment and thought content.   Assessment and Plan:  Pregnancy: G1P0 at [redacted]w[redacted]d 1. Encounter for supervision of low-risk pregnancy, antepartum - Culture, OB Urine - GC/Chlamydia probe amp ([place])not at [facility] - Obstetric Panel, Including HIV - Hemoglobin A1c - Flu Vaccine QUAD 36+ mos IM - Genetic Screening, Panor[place] and Horizon  - Baby Rx optimization discussed  - BP cuff ordered yesterday - List of OTC medications safe in pregnancy given with Peds list and CBE  information - Anatomy US scheduled 12/29/18  2. Mild intermittent asthma, unspecified whether complicated - Has not needed inhaler during this pregnancy - Symptoms are mild and intermittent   Discussed cadence of visits with Baby Rx.  Discussed nature of our practice with multiple providers, residents and students  Preterm labor/ first trimester warning symptoms and general obstetric precautions including but not limited to vaginal bleeding, contractions, leaking of fluid and fetal movement were reviewed in detail with the patient. Please refer to After Visit Summary for other counseling recommendations.   Return in about 8 weeks (around 12/22/2019) for LOB, In-Person.  Future Appointments  Date Time Provider [place]  12/30/2019  9:00 AM WH-MFC [place] 3 WH-MFCUS MFC-US    Kerry Hough, PA-C

## 2019-10-28 ENCOUNTER — Encounter: Payer: Self-pay | Admitting: General Practice

## 2019-10-28 LAB — GC/CHLAMYDIA PROBE AMP (~~LOC~~) NOT AT ARMC
Chlamydia: NEGATIVE
Comment: NEGATIVE
Comment: NORMAL
Neisseria Gonorrhea: NEGATIVE

## 2019-10-28 LAB — OBSTETRIC PANEL, INCLUDING HIV
Antibody Screen: NEGATIVE
Basophils Absolute: 0 10*3/uL (ref 0.0–0.2)
Basos: 1 %
EOS (ABSOLUTE): 0.1 10*3/uL (ref 0.0–0.4)
Eos: 1 %
HIV Screen 4th Generation wRfx: NONREACTIVE
Hematocrit: 39.1 % (ref 34.0–46.6)
Hemoglobin: 13.2 g/dL (ref 11.1–15.9)
Hepatitis B Surface Ag: NEGATIVE
Immature Grans (Abs): 0 10*3/uL (ref 0.0–0.1)
Immature Granulocytes: 0 %
Lymphocytes Absolute: 1.5 10*3/uL (ref 0.7–3.1)
Lymphs: 20 %
MCH: 29.8 pg (ref 26.6–33.0)
MCHC: 33.8 g/dL (ref 31.5–35.7)
MCV: 88 fL (ref 79–97)
Monocytes Absolute: 0.7 10*3/uL (ref 0.1–0.9)
Monocytes: 10 %
Neutrophils Absolute: 4.8 10*3/uL (ref 1.4–7.0)
Neutrophils: 68 %
Platelets: 243 10*3/uL (ref 150–450)
RBC: 4.43 x10E6/uL (ref 3.77–5.28)
RDW: 12.6 % (ref 11.7–15.4)
RPR Ser Ql: NONREACTIVE
Rh Factor: POSITIVE
Rubella Antibodies, IGG: 13.5 index (ref >0.99)
WBC: 7.2 10*3/uL (ref 3.4–10.8)

## 2019-10-28 LAB — HEMOGLOBIN A1C
Est. average glucose Bld gHb Est-mCnc: 103 mg/dL
Hgb A1c MFr Bld: 5.2 % (ref 4.8–5.6)

## 2019-10-29 LAB — CULTURE, OB URINE

## 2019-10-29 LAB — URINE CULTURE, OB REFLEX

## 2019-11-12 ENCOUNTER — Other Ambulatory Visit: Payer: Self-pay

## 2019-11-12 ENCOUNTER — Encounter (HOSPITAL_COMMUNITY): Payer: Self-pay | Admitting: Obstetrics and Gynecology

## 2019-11-12 ENCOUNTER — Inpatient Hospital Stay (HOSPITAL_COMMUNITY)
Admission: EM | Admit: 2019-11-12 | Discharge: 2019-11-12 | Disposition: A | Discharge status: Home / Self Care | Payer: Medicaid Other | Attending: Obstetrics and Gynecology | Admitting: Obstetrics and Gynecology

## 2019-11-12 DIAGNOSIS — R109 Unspecified abdominal pain: Secondary | ICD-10-CM | POA: Diagnosis present

## 2019-11-12 DIAGNOSIS — R110 Nausea: Secondary | ICD-10-CM | POA: Insufficient documentation

## 2019-11-12 DIAGNOSIS — M7918 Myalgia, other site: Secondary | ICD-10-CM | POA: Insufficient documentation

## 2019-11-12 DIAGNOSIS — O26891 Other specified pregnancy related conditions, first trimester: Secondary | ICD-10-CM | POA: Diagnosis not present

## 2019-11-12 DIAGNOSIS — J45909 Unspecified asthma, uncomplicated: Secondary | ICD-10-CM | POA: Insufficient documentation

## 2019-11-12 DIAGNOSIS — Z3A13 13 weeks gestation of pregnancy: Secondary | ICD-10-CM | POA: Insufficient documentation

## 2019-11-12 DIAGNOSIS — O99511 Diseases of the respiratory system complicating pregnancy, first trimester: Secondary | ICD-10-CM | POA: Insufficient documentation

## 2019-11-12 DIAGNOSIS — O99891 Other specified diseases and conditions complicating pregnancy: Secondary | ICD-10-CM | POA: Insufficient documentation

## 2019-11-12 LAB — URINALYSIS, ROUTINE W REFLEX MICROSCOPIC
Bilirubin Urine: NEGATIVE
Glucose, UA: NEGATIVE mg/dL
Hgb urine dipstick: NEGATIVE
Ketones, ur: NEGATIVE mg/dL
Nitrite: NEGATIVE
Protein, ur: NEGATIVE mg/dL
Specific Gravity, Urine: 1.018 (ref 1.005–1.030)
pH: 6 (ref 5.0–8.0)

## 2019-11-12 MED ORDER — ACETAMINOPHEN 500 MG PO TABS
1000.0000 mg | ORAL_TABLET | Freq: Four times a day (QID) | ORAL | Status: DC | PRN
  Administered 2019-11-12: 18:00:00 1000 mg via ORAL
  Filled 2019-11-12: qty 2

## 2019-11-12 NOTE — MAU Provider Note (Signed)
History     CSN: [id number]  Arrival date and time: 11/12/19 1544   First Provider Initiated Contact with Patient 11/12/19 1707      Chief Complaint  Patient presents with  . Abdominal Pain   19 y.o. G1 _0 .1 wks presenting with LAP. Pain started yesterday. Rates pain 5/10. Has not taken anything for it. Describes as sharp, bilateral, and intermittent. No alleviating or exaggerating factors. Denies urinary sx. Has occasional nausea in the am but no vomiting. Denies VB or discharge.   OB History    Gravida  1   Para      Term      Preterm      AB      Living        SAB      TAB      Ectopic      Multiple      Live Births              Past Medical History:  Diagnosis Date  . Asthma   . Chlamydia     Past Surgical History:  Procedure Laterality Date  . WISDOM TOOTH EXTRACTION      History reviewed. No pertinent family history.  Social History   Tobacco Use  . Smoking status: Passive Smoke Exposure - Never Smoker  . Smokeless tobacco: Never Used  Substance Use Topics  . Alcohol use: No  . Drug use: Not Currently    Types: Marijuana    Comment: last used when found out pregnant    Allergies: No Known Allergies  Medications Prior to Admission  Medication Sig Dispense Refill Last Dose  . prenatal vitamin w/FE, FA (PRENATAL 1 + 1) 27-1 MG TABS tablet Take 1 tablet by mouth daily at 12 noon. 30 tablet 11 11/11/2019 at Unknown time  . albuterol (PROVENTIL HFA;VENTOLIN HFA) 108 (90 Base) MCG/ACT inhaler Inhale 1-2 puffs into the lungs every 6 (six) hours as needed for wheezing or shortness of breath.   More than a month at Unknown time  . albuterol (PROVENTIL) (2.5 MG/3ML) 0.083% nebulizer solution Take 2.5 mg by nebulization every 6 (six) hours as needed for wheezing.   More than a month at Unknown time  . Blood Pressure Monitoring (BLOOD PRESSURE KIT) DEVI 1 Device by Does not apply route as needed. (Patient not taking: Reported on 10/27/2019) 1 each  0     Review of Systems  Constitutional: Negative for fever.  Gastrointestinal: Positive for abdominal pain and nausea. Negative for constipation, diarrhea and vomiting.  Genitourinary: Negative for dysuria, frequency, urgency, vaginal bleeding and vaginal discharge.  Musculoskeletal: Negative for back pain.   Physical Exam   Blood pressure 112/64, pulse 96, temperature 98.9 F (37.2 C), resp. rate 16, weight 62.1 kg, last menstrual period 08/12/2019, SpO2 100 %.  Physical Exam  Nursing note and vitals reviewed. Constitutional: She is oriented to person, place, and time. She appears well-developed and well-nourished. No distress.  HENT:  Head: Normocephalic and atraumatic.  Cardiovascular: Normal rate.  Respiratory: Effort normal. No respiratory distress.  GI: Soft. She exhibits no distension and no mass. There is no abdominal tenderness. There is no rebound and no guarding.  Genitourinary:    Genitourinary Comments: VE: closed/long   Musculoskeletal:        General: Normal range of motion.     Cervical back: Normal range of motion.  Neurological: She is alert and oriented to person, place, and time.  Skin: Skin is warm and  dry.  Psychiatric: She has a normal mood and affect.  FHT 157  Results for orders placed or performed during the hospital encounter of 11/12/19 (from the past 24 hour(s))  Urinalysis, Routine w reflex microscopic     Status: Abnormal   Collection Time: 11/12/19  4:25 PM  Result Value Ref Range   Color, Urine YELLOW YELLOW   APPearance HAZY (A) CLEAR   Specific Gravity, Urine 1.018 1.005 - 1.030   pH 6.0 5.0 - 8.0   Glucose, UA NEGATIVE NEGATIVE mg/dL   Hgb urine dipstick NEGATIVE NEGATIVE   Bilirubin Urine NEGATIVE NEGATIVE   Ketones, ur NEGATIVE NEGATIVE mg/dL   Protein, ur NEGATIVE NEGATIVE mg/dL   Nitrite NEGATIVE NEGATIVE   Leukocytes,Ua TRACE (A) NEGATIVE   RBC / HPF 0-5 0 - 5 RBC/hpf   WBC, UA 0-5 0 - 5 WBC/hpf   Bacteria, UA RARE (A) NONE  SEEN   Squamous Epithelial / LPF 0-5 0 - 5   Mucus PRESENT    Amorphous Crystal PRESENT    MAU Course  Procedures Meds ordered this encounter  Medications  . acetaminophen (TYLENOL) tablet 1,000 mg   MDM Labs ordered and reviewed. No evidence of UTI or threatened SAB. Pain likely MSK. Pain resolved after Tylenol. Discussed comfort measures. Stable for discharge home.   Assessment and Plan   1. [redacted] weeks gestation of pregnancy   2. Musculoskeletal pain    Discharge home Follow up at [facility] as scheduled SAB precautions Tylenol prn  Allergies as of 11/12/2019   No Known Allergies     Medication List    TAKE these medications   albuterol (2.5 MG/3ML) 0.083% nebulizer solution Commonly known as: PROVENTIL Take 2.5 mg by nebulization every 6 (six) hours as needed for wheezing.   albuterol 108 (90 Base) MCG/ACT inhaler Commonly known as: VENTOLIN HFA Inhale 1-2 puffs into the lungs every 6 (six) hours as needed for wheezing or shortness of breath.   Blood Pressure Kit Devi 1 Device by Does not apply route as needed.   prenatal vitamin w/FE, FA 27-1 MG Tabs tablet Take 1 tablet by mouth daily at 12 noon.      Susan Moore, CNM 11/12/2019, 5:14 PM

## 2019-11-12 NOTE — Discharge Instructions (Signed)
Abdominal Pain During Pregnancy  Belly (abdominal) pain is common during pregnancy. There are many possible causes. Most of the time, it is not a serious problem. Other times, it can be a sign that something is wrong with the pregnancy. Always tell your doctor if you have belly pain. Follow these instructions at home:  Do not have sex or put anything in your vagina until your pain goes away completely.  Get plenty of rest until your pain gets better.  Drink enough fluid to keep your pee (urine) pale yellow.  Take over-the-counter and prescription medicines only as told by your doctor.  Keep all follow-up visits as told by your doctor. This is important. Contact a doctor if:  Your pain continues or gets worse after resting.  You have lower belly pain that: ? Comes and goes at regular times. ? Spreads to your back. ? Feels like menstrual cramps.  You have pain or burning when you pee (urinate). Get help right away if:  You have a fever or chills.  You have vaginal bleeding.  You are leaking fluid from your vagina.  You are passing tissue from your vagina.  You throw up (vomit) for more than 24 hours.  You have watery poop (diarrhea) for more than 24 hours.  Your baby is moving less than usual.  You feel very weak or faint.  You have shortness of breath.  You have very bad pain in your upper belly. Summary  Belly (abdominal) pain is common during pregnancy. There are many possible causes.  If you have belly pain during pregnancy, tell your doctor right away.  Keep all follow-up visits as told by your doctor. This is important. This information is not intended to replace advice given to you by your health care provider. Make sure you discuss any questions you have with your health care provider. Document Revised: 02/15/2019 Document Reviewed: 01/30/2017 Elsevier Patient Education  2020 Elsevier Inc.  

## 2019-11-12 NOTE — MAU Note (Signed)
.   Susan Moore is a 19 y.o. at [redacted]w[redacted]d here in MAU reporting: lower abdominal cramping for 2 days. Denies any vaginal bleeding or abnormal discharge   LMP: 08/12/19   Onset of complaint: 2 days Pain score: 5 Vitals:   11/12/19 1546 11/12/19 1627  BP: 120/76 112/64  Pulse: (!) 107 96  Resp: 16 16  Temp: 98.7 F (37.1 C) 98.9 F (37.2 C)  SpO2: 100% 100%     FHT:157 Lab orders placed from triage: UA

## 2019-11-16 ENCOUNTER — Telehealth: Payer: Self-pay | Admitting: Lactation Services

## 2019-11-16 NOTE — Telephone Encounter (Signed)
Attempted to call pt to inform her of Genetic Screening results. Pt did not answer, voicemail not set up. Sent My Chart Message.

## 2019-11-23 ENCOUNTER — Telehealth: Payer: Self-pay | Admitting: Student

## 2019-11-23 NOTE — Telephone Encounter (Signed)
Called the patient to inform of upcoming visit for 11/26/2019, the patient verbalized understanding.

## 2019-11-26 ENCOUNTER — Ambulatory Visit (INDEPENDENT_AMBULATORY_CARE_PROVIDER_SITE_OTHER): Payer: Medicaid Other | Admitting: Lactation Services

## 2019-11-26 ENCOUNTER — Other Ambulatory Visit: Payer: Self-pay

## 2019-11-26 VITALS — Wt 140.4 lb

## 2019-11-26 DIAGNOSIS — R300 Dysuria: Secondary | ICD-10-CM

## 2019-11-26 LAB — POCT URINALYSIS DIP (DEVICE)
Bilirubin Urine: NEGATIVE
Glucose, UA: NEGATIVE mg/dL
Hgb urine dipstick: NEGATIVE
Ketones, ur: NEGATIVE mg/dL
Nitrite: NEGATIVE
Protein, ur: NEGATIVE mg/dL
Specific Gravity, Urine: 1.02 (ref 1.005–1.030)
Urobilinogen, UA: 0.2 mg/dL (ref 0.0–1.0)
pH: 7.5 (ref 5.0–8.0)

## 2019-11-26 MED ORDER — NITROFURANTOIN MONOHYD MACRO 100 MG PO CAPS: 100.0000 mg | ORAL_CAPSULE | Freq: Two times a day (BID) | ORAL | 0 refills | Status: DC

## 2019-11-26 NOTE — Progress Notes (Signed)
Pt here to rule out UTI. Pt to bathroom and was not able to void. 2 glasses of water given to try to get pt to void.   Pt voices that her symptoms started about a week ago. She reports she is having difficulty with getting the urine to flow every time she is voiding. She is having pain/burning at the end of the urination.  She reports she has had a UTI in the past and it feels similar. She feels like the symptoms are getting worse.   Pt reports she is drinking a lot of fluids and cranberry juice. Urine is cloudy in appearance. Spoke with Dr. Harolyn Rutherford who recommended she be treated with ATB and send urine Culture.   Enc pt to drink plenty of fluids and to let us know if she is not feeling better after taking ATB. Pt to call as needed.

## 2019-11-28 LAB — URINE CULTURE, OB REFLEX: Organism ID, Bacteria: NO GROWTH

## 2019-11-28 LAB — CULTURE, OB URINE

## 2019-11-30 NOTE — Progress Notes (Signed)
Patient seen and assessed by nursing staff during this encounter. I have reviewed the chart and agree with the documentation and plan.  Verita Schneiders, MD 11/30/2019 8:33 AM

## 2019-12-01 ENCOUNTER — Encounter: Payer: Self-pay | Admitting: Medical

## 2019-12-01 ENCOUNTER — Encounter: Payer: Self-pay | Admitting: *Deleted

## 2019-12-01 DIAGNOSIS — Z148 Genetic carrier of other disease: Secondary | ICD-10-CM

## 2019-12-01 HISTORY — DX: Genetic carrier of other disease: Z14.8

## 2019-12-08 ENCOUNTER — Ambulatory Visit (INDEPENDENT_AMBULATORY_CARE_PROVIDER_SITE_OTHER): Payer: Medicaid Other | Admitting: Medical

## 2019-12-08 ENCOUNTER — Encounter: Payer: Self-pay | Admitting: Medical

## 2019-12-08 ENCOUNTER — Other Ambulatory Visit: Payer: Self-pay

## 2019-12-08 VITALS — BP 119/79 | HR 108 | Wt 142.9 lb

## 2019-12-08 DIAGNOSIS — Z3A16 16 weeks gestation of pregnancy: Secondary | ICD-10-CM

## 2019-12-08 DIAGNOSIS — Z3492 Encounter for supervision of normal pregnancy, unspecified, second trimester: Secondary | ICD-10-CM

## 2019-12-08 DIAGNOSIS — Z148 Genetic carrier of other disease: Secondary | ICD-10-CM

## 2019-12-08 DIAGNOSIS — J4520 Mild intermittent asthma, uncomplicated: Secondary | ICD-10-CM

## 2019-12-08 LAB — POCT URINALYSIS DIP (DEVICE)
Bilirubin Urine: NEGATIVE
Glucose, UA: NEGATIVE mg/dL
Hgb urine dipstick: NEGATIVE
Ketones, ur: NEGATIVE mg/dL
Nitrite: NEGATIVE
Protein, ur: NEGATIVE mg/dL
Specific Gravity, Urine: 1.02 (ref 1.005–1.030)
Urobilinogen, UA: 0.2 mg/dL (ref 0.0–1.0)
pH: 7 (ref 5.0–8.0)

## 2019-12-08 NOTE — Progress Notes (Signed)
   PRENATAL VISIT NOTE  Subjective:  Susan Moore is a 19 y.o. G1P0 at [redacted]w[redacted]d being seen today for ongoing prenatal care.  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  Patient reports no complaints.  Contractions: Not present. Vag. Bleeding: None.  Movement: Present. Denies leaking of fluid.   The following portions of the patient's history were reviewed and updated as appropriate: allergies, current medications, past family history, past medical history, past social history, past surgical history and problem list.   Objective:   Vitals:   12/08/19 1024  BP: 119/79  Pulse: (!) 108  Weight: 142 lb 14.4 oz (64.8 kg)    Fetal Status: Fetal Heart Rate (bpm): 153   Movement: Present     General:  Alert, oriented and cooperative. Patient is in no acute distress.  Skin: Skin is warm and dry. No rash noted.   Cardiovascular: Normal heart rate noted  Respiratory: Normal respiratory effort, no problems with respiration noted  Abdomen: Soft, gravid, appropriate for gestational age.  Pain/Pressure: Absent     Pelvic: Cervical exam deferred        Extremities: Normal range of motion.  Edema: None  Mental Status: Normal mood and affect. Normal behavior. Normal judgment and thought content.   Assessment and Plan:  Pregnancy: G1P0 at [redacted]w[redacted]d 1. Encounter for supervision of low-risk pregnancy in second trimester - AFP, Serum, Open Spina Bifida - Still unsure of MOC, information given  - Has Peds list - Anatomy US scheduled 2/18  2. 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 Mild intermittent asthma without complication - Has not needed inhaler lately   Preterm labor/ second trimester warning signs symptoms and general obstetric precautions including but not  limited to vaginal bleeding, contractions, leaking of fluid and fetal movement were reviewed in detail with the patient. Please refer to After Visit Summary for other counseling recommendations.   Return in about 8 weeks (around 02/02/2020) for LOB, Virtual.  Future Appointments  Date Time Provider [place]  12/30/2019  9:00 AM WH-MFC [place] 3 WH-MFCUS MFC-US    Kerry Hough, PA-C

## 2019-12-08 NOTE — Patient Instructions (Addendum)
Second Trimester of Pregnancy  The second trimester is from week 14 through week 27 (month 4 through 6). This is often the time in pregnancy that you feel your best. Often times, morning sickness has lessened or quit. You may have more energy, and you may get hungry more often. Your unborn baby is growing rapidly. At the end of the sixth month, he or she is about 9 inches long and weighs about 1 pounds. You will likely feel the baby move between 18 and 20 weeks of pregnancy. Follow these instructions at home: Medicines  Take over-the-counter and prescription medicines only as told by your doctor. Some medicines are safe and some medicines are not safe during pregnancy.  Take a prenatal vitamin that contains at least 600 micrograms (mcg) of folic acid.  If you have trouble pooping (constipation), take medicine that will make your stool soft (stool softener) if your doctor approves. Eating and drinking   Eat regular, healthy meals.  Avoid raw meat and uncooked cheese.  If you get low calcium from the food you eat, talk to your doctor about taking a daily calcium supplement.  Avoid foods that are high in fat and sugars, such as fried and sweet foods.  If you feel sick to your stomach (nauseous) or throw up (vomit): ? Eat 4 or 5 small meals a day instead of 3 large meals. ? Try eating a few soda crackers. ? Drink liquids between meals instead of during meals.  To prevent constipation: ? Eat foods that are high in fiber, like fresh fruits and vegetables, whole grains, and beans. ? Drink enough fluids to keep your pee (urine) clear or pale yellow. Activity  Exercise only as told by your doctor. Stop exercising if you start to have cramps.  Do not exercise if it is too hot, too humid, or if you are in a place of great height (high altitude).  Avoid heavy lifting.  Wear low-heeled shoes. Sit and stand up straight.  You can continue to have sex unless your doctor tells you not  to. Relieving pain and discomfort  Wear a good support bra if your breasts are tender.  Take warm water baths (sitz baths) to soothe pain or discomfort caused by hemorrhoids. Use hemorrhoid cream if your doctor approves.  Rest with your legs raised if you have leg cramps or low back pain.  If you develop puffy, bulging veins (varicose veins) in your legs: ? Wear support hose or compression stockings as told by your doctor. ? Raise (elevate) your feet for 15 minutes, 3-4 times a day. ? Limit salt in your food. Prenatal care  Write down your questions. Take them to your prenatal visits.  Keep all your prenatal visits as told by your doctor. This is important. Safety  Wear your seat belt when driving.  Make a list of emergency phone numbers, including numbers for family, friends, the hospital, and police and fire departments. General instructions  Ask your doctor about the right foods to eat or for help finding a counselor, if you need these services.  Ask your doctor about local prenatal classes. Begin classes before month 6 of your pregnancy.  Do not use hot tubs, steam rooms, or saunas.  Do not douche or use tampons or scented sanitary pads.  Do not cross your legs for long periods of time.  Visit your dentist if you have not done so. Use a soft toothbrush to brush your teeth. Floss gently.  Avoid all smoking, herbs,   and alcohol. Avoid drugs that are not approved by your doctor.  Do not use any products that contain nicotine or tobacco, such as cigarettes and e-cigarettes. If you need help quitting, ask your doctor.  Avoid cat litter boxes and soil used by cats. These carry germs that can cause birth defects in the baby and can cause a loss of your baby (miscarriage) or stillbirth. Contact a doctor if:  You have mild cramps or pressure in your lower belly.  You have pain when you pee (urinate).  You have bad smelling fluid coming from your vagina.  You continue to  feel sick to your stomach (nauseous), throw up (vomit), or have watery poop (diarrhea).  You have a nagging pain in your belly area.  You feel dizzy. Get help right away if:  You have a fever.  You are leaking fluid from your vagina.  You have spotting or bleeding from your vagina.  You have severe belly cramping or pain.  You lose or gain weight rapidly.  You have trouble catching your breath and have chest pain.  You notice sudden or extreme puffiness (swelling) of your face, hands, ankles, feet, or legs.  You have not felt the baby move in over an hour.  You have severe headaches that do not go away when you take medicine.  You have trouble seeing. Summary  The second trimester is from week 14 through week 27 (months 4 through 6). This is often the time in pregnancy that you feel your best.  To take care of yourself and your unborn baby, you will need to eat healthy meals, take medicines only if your doctor tells you to do so, and do activities that are safe for you and your baby.  Call your doctor if you get sick or if you notice anything unusual about your pregnancy. Also, call your doctor if you need help with the right food to eat, or if you want to know what activities are safe for you. This information is not intended to replace advice given to you by your health care provider. Make sure you discuss any questions you have with your health care provider. Document Revised: 02/19/2019 Document Reviewed: 12/03/2016 Elsevier Patient Education  2020 [employer].  Contraception Choices Contraception, also called birth control, means things to use or ways to try not to get pregnant. Hormonal birth control This kind of birth control uses hormones. Here are some types of hormonal birth control:  A tube that is put under skin of the arm (implant). The tube can stay in for as long as 3 years.  Shots to get every 3 months (injections).  Pills to take every day (birth  control pills).  A patch to change 1 time each week for 3 weeks (birth control patch). After that, the patch is taken off for 1 week.  A ring to put in the vagina. The ring is left in for 3 weeks. Then it is taken out of the vagina for 1 week. Then a new ring is put in.  Pills to take after unprotected sex (emergency birth control pills). Barrier birth control Here are some types of barrier birth control:  A thin covering that is put on the penis before sex (female condom). The covering is thrown away after sex.  A soft, loose covering that is put in the vagina before sex (female condom). The covering is thrown away after sex.  A rubber bowl that sits over the cervix (diaphragm). The bowl  must be made for you. The bowl is put into the vagina before sex. The bowl is left in for 6-8 hours after sex. It is taken out within 24 hours.  A small, soft cup that fits over the cervix (cervical cap). The cup must be made for you. The cup can be left in for 6-8 hours after sex. It is taken out within 48 hours.  A sponge that is put into the vagina before sex. It must be left in for at least 6 hours after sex. It must be taken out within 30 hours. Then it is thrown away.  A chemical that kills or stops sperm from getting into the uterus (spermicide). It may be a pill, cream, jelly, or foam to put in the vagina. The chemical should be used at least 10-15 minutes before sex. IUD (intrauterine) birth control An IUD is a small, T-shaped piece of plastic. It is put inside the uterus. There are two kinds:  Hormone IUD. This kind can stay in for 3-5 years.  Copper IUD. This kind can stay in for 10 years. Permanent birth control Here are some types of permanent birth control:  Surgery to block the fallopian tubes.  Having an insert put into each fallopian tube.  Surgery to tie off the tubes that carry sperm (vasectomy). Natural planning birth control Here are some types of natural planning birth  control:  Not having sex on the days the woman could get pregnant.  Using a calendar: ? To keep track of the length of each period. ? To find out what days pregnancy can happen. ? To plan to not have sex on days when pregnancy can happen.  Watching for symptoms of ovulation and not having sex during ovulation. One way the woman can check for ovulation is to check her temperature.  Waiting to have sex until after ovulation. Summary  Contraception, also called birth control, means things to use or ways to try not to get pregnant.  Hormonal methods of birth control include implants, injections, pills, patches, vaginal rings, and emergency birth control pills.  Barrier methods of birth control can include female condoms, female condoms, diaphragms, cervical caps, sponges, and spermicides.  There are two types of IUD (intrauterine device) birth control. An IUD can be put in a woman's uterus to prevent pregnancy for 3-5 years.  Permanent sterilization can be done through a procedure for males, females, or both.  Natural planning methods involve not having sex on the days when the woman could get pregnant. This information is not intended to replace advice given to you by your health care provider. Make sure you discuss any questions you have with your health care provider. Document Revised: 02/17/2019 Document Reviewed: 11/07/2016 Elsevier Patient Education  [employer].

## 2019-12-10 LAB — AFP, SERUM, OPEN SPINA BIFIDA
AFP MoM: 1.81
AFP Value: 68.7 ng/mL
Gest. Age on Collection Date: 16 weeks
Maternal Age At EDD: 21.2 yr
OSBR Risk 1 IN: 2495
Test Results:: NEGATIVE
Weight: 142 [lb_av]

## 2019-12-30 ENCOUNTER — Ambulatory Visit (HOSPITAL_COMMUNITY): Payer: Medicaid Other

## 2019-12-30 ENCOUNTER — Encounter: Payer: Self-pay | Admitting: Medical

## 2019-12-31 ENCOUNTER — Other Ambulatory Visit: Payer: Self-pay | Admitting: Medical

## 2019-12-31 DIAGNOSIS — J4520 Mild intermittent asthma, uncomplicated: Secondary | ICD-10-CM

## 2019-12-31 MED ORDER — ALBUTEROL SULFATE HFA 108 (90 BASE) MCG/ACT IN AERS: 1.0000 | INHALATION_SPRAY | Freq: Four times a day (QID) | RESPIRATORY_TRACT | 1 refills | Status: DC | PRN

## 2020-01-06 ENCOUNTER — Other Ambulatory Visit (HOSPITAL_COMMUNITY): Payer: Self-pay | Admitting: *Deleted

## 2020-01-06 ENCOUNTER — Encounter: Payer: Self-pay | Admitting: Medical

## 2020-01-06 ENCOUNTER — Ambulatory Visit (HOSPITAL_COMMUNITY)
Admission: RE | Admit: 2020-01-06 | Discharge: 2020-01-06 | Disposition: A | Discharge status: Home / Self Care | Payer: Medicaid Other | Source: Ambulatory Visit | Attending: Medical | Admitting: Medical

## 2020-01-06 ENCOUNTER — Other Ambulatory Visit: Payer: Self-pay | Admitting: Medical

## 2020-01-06 ENCOUNTER — Other Ambulatory Visit: Payer: Self-pay

## 2020-01-06 DIAGNOSIS — Z3A21 21 weeks gestation of pregnancy: Secondary | ICD-10-CM | POA: Diagnosis not present

## 2020-01-06 DIAGNOSIS — Z3490 Encounter for supervision of normal pregnancy, unspecified, unspecified trimester: Secondary | ICD-10-CM | POA: Diagnosis not present

## 2020-01-06 DIAGNOSIS — O358XX0 Maternal care for other (suspected) fetal abnormality and damage, not applicable or unspecified: Secondary | ICD-10-CM

## 2020-01-06 DIAGNOSIS — Z362 Encounter for other antenatal screening follow-up: Secondary | ICD-10-CM

## 2020-01-06 DIAGNOSIS — O350XX0 Maternal care for (suspected) central nervous system malformation in fetus, not applicable or unspecified: Secondary | ICD-10-CM | POA: Insufficient documentation

## 2020-01-06 DIAGNOSIS — O3503X0 Maternal care for (suspected) central nervous system malformation or damage in fetus, choroid plexus cysts, not applicable or unspecified: Secondary | ICD-10-CM | POA: Insufficient documentation

## 2020-01-10 ENCOUNTER — Encounter: Payer: Self-pay | Admitting: Medical

## 2020-01-10 ENCOUNTER — Other Ambulatory Visit: Payer: Self-pay

## 2020-01-10 MED ORDER — DOCUSATE SODIUM 100 MG PO CAPS: 100.0000 mg | ORAL_CAPSULE | Freq: Two times a day (BID) | ORAL | 0 refills | Status: DC

## 2020-01-10 NOTE — Progress Notes (Signed)
Patient sent a message about not being able to make a bowel movement in 3-4 days advised ill send colace to her pharmacy to take 100mg  twice daily.

## 2020-01-19 ENCOUNTER — Encounter: Payer: Self-pay | Admitting: Medical

## 2020-01-20 ENCOUNTER — Ambulatory Visit (HOSPITAL_COMMUNITY): Payer: Self-pay | Admitting: Genetic Counselor

## 2020-01-20 ENCOUNTER — Ambulatory Visit (HOSPITAL_COMMUNITY): Payer: Medicaid Other | Attending: Obstetrics and Gynecology | Admitting: Genetic Counselor

## 2020-01-20 DIAGNOSIS — Z3A23 23 weeks gestation of pregnancy: Secondary | ICD-10-CM | POA: Diagnosis not present

## 2020-01-20 DIAGNOSIS — D563 Thalassemia minor: Secondary | ICD-10-CM

## 2020-01-20 DIAGNOSIS — Z315 Encounter for genetic counseling: Secondary | ICD-10-CM

## 2020-01-20 DIAGNOSIS — Z148 Genetic carrier of other disease: Secondary | ICD-10-CM | POA: Diagnosis not present

## 2020-01-20 NOTE — Progress Notes (Signed)
01/20/2020  Anesia Huther 05/07/2001 MRN: [id number] DOV: 01/20/2020  I connected withMs.Newellon3/11/21at1:00 PM ESTbytelephone and verified that I am speaking with the correct person using two identifiers.Ms.[facility]ellwas [facility]to the [facility] for a genetics consultation regardingher carrier status for alpha-thalassemia and spinal muscular atrophy. Ms. Serrette presented to her appointment alone.  Indication for genetic counseling - Silent carrier for alpha-thalassemia - Increased risk to be silent carrier for spinal muscular atrophy   Prenatal history  Ms. Bohmer is a G52P0, 19 y.o. female. Her current pregnancy has completed [redacted]w[redacted]d (Estimated Date of Delivery: 05/18/20).  Ms. Deanda denied exposure to environmental toxins or chemical agents. She denied the use of alcohol, tobacco or street drugs. She reported taking prenatal vitamins. She denied significant viral illnesses, fevers, and bleeding during the course of her pregnancy. Her medical and surgical histories were noncontributory.  Family History  A three generation pedigree was drafted and reviewed. The family history is remarkable for the following:  - Ms. Hoffa has a maternal aunt who has a son and a daughter with sickle cell disease, AKA sickle cell anemia (SCA). This means that Ms. Wirz's aunt is an obligate carrier for SCA. Carriers of SCA are often referred to as having sickle cell "trait". Given that Ms. Nevers's aunt must carry sickle cell trait, Ms. Corbo would have a 25% chance of also being a carrier for SCA. However, we discussed that Ms. Castrillon had carrier screening performed for several conditions, including hemoglobinopathies such as SCA. Carrier screening did not identify her as a carrier of SCA; thus, her risk of having a child with SCA is significantly reduced.  The remaining family histories were reviewed and found to be noncontributory for birth defects, intellectual  disability, recurrent pregnancy loss, and known genetic conditions. Ms. Ringen had limited information about the father of the baby's family history; thus, risk assessment was limited.   The patient's ethnicity is African American. The father of the pregnancy's ethnicity is African American. Ashkenazi Jewish ancestry and consanguinity were denied. Pedigree will be scanned under Media.  Discussion  Ms. Gaspari had Horizon-14 carrier screening performed through [place]. The results of the screen identified her as a silent carrier for alpha-thalassemia (aa/a-). Alpha-thalassemia is different in its inheritance compared to other hemoglobinopathies as there are two copies of two alpha globin genes (HBA1 and HBA2) on each chromosome 16, or four alpha globin genes total (aa/aa). A person can be a carrier of one alpha gene mutation (aa/a-), also referred to as a "silent carrier". A person who carries two alpha globin gene mutations can either carry them in cis (both on the same chromosome, denoted as aa/--) or in trans (on different chromosomes, denoted as a-/a-). Alpha-thalassemia carriers of two mutations who have African American ancestry are more likely to have a trans arrangement (a-/a-); cis configuration is reported to be rare in individuals with African American ancestry.     There are several different forms of alpha-thalassemia. The most severe form of alpha-thalassemia, Hb Barts, is associated with an absence of alpha globin chain synthesis as a result of deletions of all four alpha globin genes (--/--).  Given that Ms. Sosna is a silent carrier (aa/a-), her pregnancies would not be at increased risk for Hb Barts, even if her partner is a carrier for alpha-thalassemia, as she will always pass on at least one copy of the alpha globin gene to her children. Hemoglobin H (HbH) disease is caused by three deleted or dysfunctioning alpha globin  alleles (a-/--) and is characterized by microcytic hypochromic  hemolytic anemia, hepatosplenomegaly, mild jaundice, growth retardation, and sometimes thalassemia-like bone changes. Given Ms. Niswander's silent carrier status (aa/a-), the current fetus would only be at risk for HbH disease (a-/--), if the father of the baby is a carrier for two alpha globin mutations in cis (aa/--). If this is the case, the risk for HbH disease in the pregnancy would be 1 in 4 (25%). However, if the father of the baby is a carrier for two alpha globin mutations, he would be more likely to carry them in trans configuration (a-/a-) than the cis configuration (aa/--), given his ethnicity. If he is a carrier of alpha-thalassemia in trans, then the pregnancy would not be at increased risk for HbH disease. Based on the carrier frequency for alpha-thalassemia in the African American population, the father of the baby has a 1 in 29 chance of being any type of carrier for alpha-thalassemia.   Ms. Rieben was also found to have 2 copies of the SMN1 gene on Horizon-14 carrier screening; however, she also has the c.*3+80T>G polymorphism of SMN1 in intron 7 (also known as [id number]). This puts her at increased risk (1 in 43) to be a silent 2+0 carrier for SMA. SMA is a condition caused by mutations in the SMN1 gene. Typically, individuals have two copies of the SMN1 gene, with one copy present on each chromosome. In SMA silent carriers, both copies of the SMN1 gene are present on one chromosome, with no copies of SMN1 present on the other chromosome.   SMA is characterized by progressive muscle weakness and atrophy due to degeneration and loss of anterior horn cells (lower motor neurons) in the spinal cord and brain stem. We discussed the different types of SMA (0, I, II, and III), including differences in severity and age of onset. We also reviewed the autosomal recessive inheritance pattern of SMA. We discussed that the couple only has a chance of having a child with SMA if both parents are identified as  carriers for the condition. Based on the carrier frequency for SMA in the African American population, the father of the baby currently has a 1 in 33 chance of being a carrier of SMA. Without partner screening to refine risk and based on the father of the baby's ethnicity, the couple currently has a 1 in 40 (0.01%) risk of having a child with SMA. If the father of the baby were found to have 2 copies of SMN1, his risk of being a carrier is reduced but not eliminated. However, if both parents are carriers of SMA, there is a 1 in 4 (25%) chance of having an affected fetus.   Ms. Hanlan carrier screening was negative for the other 12 conditions screened. Thus, her risk to be a carrier for these additional conditions (listed separately in the laboratory report) has been reduced but not eliminated. This also significantly reduces her risk of having a child affected by one of these conditions. We discussed that carrier testing for alpha-thalassemia and SMA is recommended for the father of the baby. Ms. Kresge indicated that she is interested in pursuing partner carrier screening.  We also reviewed that Ms. Alferd Apa had Panorama NIPS through the laboratory [facility] that was low-risk for fetal aneuploidies. We reviewed that these results showed a less than 1 in 10,000 risk for trisomies 21, 18 and 13, and monosomy X (Turner syndrome).  In addition, the risk for triploidy and sex chromosome trisomies (47,XXX and 47,XXY)  was also low. Ms. Goertzen elected to have cfDNA analysis for 22q11.2 deletion syndrome, which was also low risk (1 in 9000). We reviewed that while this testing identifies 94-99% of pregnancies with trisomy 53, trisomy 34, trisomy 63, sex chromosome aneuploidies, and triploidy, it is NOT diagnostic. A positive test result requires confirmation by CVS or amniocentesis, and a negative test result does not rule out a fetal chromosome abnormality. She also understands that this testing does not identify all  genetic conditions.  Ms. Hammer was also counseled regarding diagnostic testing via amniocentesis. We discussed the technical aspects of the procedure and quoted up to a 1 in 500 (0.2%) risk for spontaneous pregnancy loss or other adverse pregnancy outcomes as a result of amniocentesis. Cultured cells from an amniocentesis sample allow for the visualization of a fetal karyotype, which can detect >99% of chromosomal aberrations. Chromosomal microarray can also be performed to identify smaller deletions or duplications of fetal chromosomal material. Amniocentesis could also be performed to assess whether the baby is affected by alpha-thalassemia or SMA. After careful consideration, Ms. Provencal declined amniocentesis at this time. She understands that amniocentesis is available at any point after 16 weeks of pregnancy and that she may opt to undergo the procedure at a later date should she change her mind.  Ms. Lizotte was interested in pursuing carrier screening for alpha-thalassemia and SMA for the father of the baby, Faustino Congress. However, the father of the pregnancy is currently incarcerated in [place]. I told Ms. Yox that I will look into how to coordinate testing for Mr. Jay Schlichter and keep her updated.  I counseled Ms. Nell regarding the above risks and available options. The approximate face-to-face time with the genetic counselor was 30 minutes.  In summary:  Discussed carrier screening results and options for follow-up testing  Silent carrier for alpha-thalassemia  Increased risk to be silent carrier for spinal muscular atrophy  Desires partner carrier screening. Partner is currently incarcerated. I will look into the possibility of coordinating testing  Reviewed low-risk NIPS results  Reduction in risk for Down syndrome,trisomy 18,trisomy 48, sex chromosome aneuploidies, and 22q11.2deletionsyndrome  Offered additional testing and screening  Declined  amniocentesis  Reviewed family history concerns   Buelah Manis, MS, Counselling psychologist

## 2020-01-28 ENCOUNTER — Encounter: Payer: Self-pay | Admitting: Medical

## 2020-02-02 ENCOUNTER — Telehealth (INDEPENDENT_AMBULATORY_CARE_PROVIDER_SITE_OTHER): Payer: Medicaid Other | Admitting: Medical

## 2020-02-02 ENCOUNTER — Encounter: Payer: Self-pay | Admitting: Medical

## 2020-02-02 ENCOUNTER — Other Ambulatory Visit: Payer: Self-pay

## 2020-02-02 VITALS — BP 118/70 | HR 93

## 2020-02-02 DIAGNOSIS — Z148 Genetic carrier of other disease: Secondary | ICD-10-CM | POA: Diagnosis not present

## 2020-02-02 DIAGNOSIS — Z3A24 24 weeks gestation of pregnancy: Secondary | ICD-10-CM

## 2020-02-02 DIAGNOSIS — O350XX0 Maternal care for (suspected) central nervous system malformation in fetus, not applicable or unspecified: Secondary | ICD-10-CM | POA: Diagnosis not present

## 2020-02-02 DIAGNOSIS — Z3492 Encounter for supervision of normal pregnancy, unspecified, second trimester: Secondary | ICD-10-CM

## 2020-02-02 DIAGNOSIS — O3503X0 Maternal care for (suspected) central nervous system malformation or damage in fetus, choroid plexus cysts, not applicable or unspecified: Secondary | ICD-10-CM

## 2020-02-02 NOTE — Patient Instructions (Signed)

## 2020-02-02 NOTE — Progress Notes (Signed)
I connected with  [name] on 02/02/20 at  1:15 PM EDT by telephone and verified that I am speaking with the correct person using two identifiers.   I discussed the limitations, risks, security and privacy concerns of performing an evaluation and management service by telephone and the availability of in person appointments. I also discussed with the patient that there may be a patient responsible charge related to this service. The patient expressed understanding and agreed to proceed.  [place], [place] 02/02/2020  1:09 PM

## 2020-02-02 NOTE — Progress Notes (Signed)
I connected with [name] on 02/02/20 at  1:15 PM EDT by: MyChart and verified that I am speaking with the correct person using two identifiers.  Patient is located at home and provider is located at [facility]     The purpose of this virtual visit is to provide medical care while limiting exposure to the novel coronavirus. I discussed the limitations, risks, security and privacy concerns of performing an evaluation and management service by MyChart and the availability of in person appointments. I also discussed with the patient that there may be a patient responsible charge related to this service. By engaging in this virtual visit, you consent to the provision of healthcare.  Additionally, you authorize for your insurance to be billed for the services provided during this visit.  The patient expressed understanding and agreed to proceed.  The following staff members participated in the virtual visit:  Lowanda Foster, [place] VISIT NOTE  Subjective:  Susan Moore is a 19 y.o. G1P0 at [redacted]w[redacted]d  for phone visit for ongoing prenatal care.  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  Patient reports no complaints.  Contractions: Not present. Vag. Bleeding: None.  Movement: Present. Denies leaking of fluid.   The following portions of the patient's history were reviewed and updated as appropriate: allergies, current medications, past family history, past medical history, past social history, past surgical history and problem list.   Objective:   Vitals:   02/02/20 1310  BP: 118/70  Pulse: 93   Self-Obtained  Fetal Status:     Movement: Present     Assessment and Plan:  Pregnancy: G1P0 at [redacted]w[redacted]d 1. Encounter for supervision of low-risk pregnancy in second trimester -  Doing well - Discussed third trimester labs and TDap at next visit   2. 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 Choroid plexus cyst of fetus affecting care of mother, antepartum, single or unspecified fetus - Follow-up US 4/22  Preterm labor symptoms and general obstetric precautions including but not limited to vaginal bleeding, contractions, leaking of fluid and fetal movement were reviewed in detail with the patient.  Return in about 4 weeks (around 03/01/2020) for LOB, 28 week labs (fasting), In-Person.  Future Appointments  Date Time Provider [place]  03/02/2020  1:45 PM [place] [facility]  03/02/2020  1:45 PM [place] [facility]     Time spent on virtual visit: 10 minutes  Kerry Hough, PA-C

## 2020-02-20 ENCOUNTER — Encounter: Payer: Self-pay | Admitting: Medical

## 2020-02-21 ENCOUNTER — Encounter: Payer: Self-pay | Admitting: Medical

## 2020-02-22 ENCOUNTER — Other Ambulatory Visit: Payer: Self-pay | Admitting: Medical

## 2020-02-22 DIAGNOSIS — J4520 Mild intermittent asthma, uncomplicated: Secondary | ICD-10-CM

## 2020-02-22 MED ORDER — ALBUTEROL SULFATE HFA 108 (90 BASE) MCG/ACT IN AERS: 1.0000 | INHALATION_SPRAY | Freq: Four times a day (QID) | RESPIRATORY_TRACT | 1 refills | Status: DC | PRN

## 2020-03-02 ENCOUNTER — Ambulatory Visit (HOSPITAL_COMMUNITY)
Admission: RE | Admit: 2020-03-02 | Discharge: 2020-03-02 | Disposition: A | Discharge status: Home / Self Care | Payer: Medicaid Other | Source: Ambulatory Visit | Attending: Obstetrics and Gynecology | Admitting: Obstetrics and Gynecology

## 2020-03-02 ENCOUNTER — Ambulatory Visit (HOSPITAL_COMMUNITY): Payer: Medicaid Other | Admitting: *Deleted

## 2020-03-02 ENCOUNTER — Other Ambulatory Visit: Payer: Self-pay

## 2020-03-02 ENCOUNTER — Encounter (HOSPITAL_COMMUNITY): Payer: Self-pay

## 2020-03-02 DIAGNOSIS — Z3A29 29 weeks gestation of pregnancy: Secondary | ICD-10-CM

## 2020-03-02 DIAGNOSIS — O358XX0 Maternal care for other (suspected) fetal abnormality and damage, not applicable or unspecified: Secondary | ICD-10-CM

## 2020-03-02 DIAGNOSIS — Z3492 Encounter for supervision of normal pregnancy, unspecified, second trimester: Secondary | ICD-10-CM | POA: Diagnosis not present

## 2020-03-02 DIAGNOSIS — Z362 Encounter for other antenatal screening follow-up: Secondary | ICD-10-CM | POA: Diagnosis not present

## 2020-03-03 ENCOUNTER — Ambulatory Visit (INDEPENDENT_AMBULATORY_CARE_PROVIDER_SITE_OTHER): Payer: Medicaid Other | Admitting: Medical

## 2020-03-03 ENCOUNTER — Encounter: Payer: Self-pay | Admitting: Medical

## 2020-03-03 ENCOUNTER — Other Ambulatory Visit: Payer: Medicaid Other

## 2020-03-03 VITALS — BP 127/64 | HR 92

## 2020-03-03 DIAGNOSIS — O99513 Diseases of the respiratory system complicating pregnancy, third trimester: Secondary | ICD-10-CM | POA: Diagnosis not present

## 2020-03-03 DIAGNOSIS — Z3A29 29 weeks gestation of pregnancy: Secondary | ICD-10-CM | POA: Diagnosis not present

## 2020-03-03 DIAGNOSIS — Z3492 Encounter for supervision of normal pregnancy, unspecified, second trimester: Secondary | ICD-10-CM

## 2020-03-03 DIAGNOSIS — O26843 Uterine size-date discrepancy, third trimester: Secondary | ICD-10-CM

## 2020-03-03 DIAGNOSIS — Z23 Encounter for immunization: Secondary | ICD-10-CM

## 2020-03-03 DIAGNOSIS — O3503X0 Maternal care for (suspected) central nervous system malformation or damage in fetus, choroid plexus cysts, not applicable or unspecified: Secondary | ICD-10-CM

## 2020-03-03 DIAGNOSIS — Z148 Genetic carrier of other disease: Secondary | ICD-10-CM | POA: Diagnosis not present

## 2020-03-03 DIAGNOSIS — J4520 Mild intermittent asthma, uncomplicated: Secondary | ICD-10-CM | POA: Diagnosis not present

## 2020-03-03 DIAGNOSIS — O350XX0 Maternal care for (suspected) central nervous system malformation in fetus, not applicable or unspecified: Secondary | ICD-10-CM | POA: Diagnosis not present

## 2020-03-03 NOTE — Patient Instructions (Addendum)
Fetal Movement Counts Patient Name: ________________________________________________ Patient Due Date: ____________________ What is a fetal movement count?  A fetal movement count is the number of times that you feel your baby move during a certain amount of time. This may also be called a fetal kick count. A fetal movement count is recommended for every pregnant woman. You may be asked to start counting fetal movements as early as week 28 of your pregnancy. Pay attention to when your baby is most active. You may notice your baby's sleep and wake cycles. You may also notice things that make your baby move more. You should do a fetal movement count:  When your baby is normally most active.  At the same time each day. A good time to count movements is while you are resting, after having something to eat and drink. How do I count fetal movements? 1. Find a quiet, comfortable area. Sit, or lie down on your side. 2. Write down the date, the start time and stop time, and the number of movements that you felt between those two times. Take this information with you to your health care visits. 3. Write down your start time when you feel the first movement. 4. Count kicks, flutters, swishes, rolls, and jabs. You should feel at least 10 movements. 5. You may stop counting after you have felt 10 movements, or if you have been counting for 2 hours. Write down the stop time. 6. If you do not feel 10 movements in 2 hours, contact your health care provider for further instructions. Your health care provider may want to do additional tests to assess your baby's well-being. Contact a health care provider if:  You feel fewer than 10 movements in 2 hours.  Your baby is not moving like he or she usually does. Date: ____________ Start time: ____________ Stop time: ____________ Movements: ____________ Date: ____________ Start time: ____________ Stop time: ____________ Movements: ____________ Date: ____________  Start time: ____________ Stop time: ____________ Movements: ____________ Date: ____________ Start time: ____________ Stop time: ____________ Movements: ____________ Date: ____________ Start time: ____________ Stop time: ____________ Movements: ____________ Date: ____________ Start time: ____________ Stop time: ____________ Movements: ____________ Date: ____________ Start time: ____________ Stop time: ____________ Movements: ____________ Date: ____________ Start time: ____________ Stop time: ____________ Movements: ____________ Date: ____________ Start time: ____________ Stop time: ____________ Movements: ____________ This information is not intended to replace advice given to you by your health care provider. Make sure you discuss any questions you have with your health care provider. Document Revised: 06/17/2019 Document Reviewed: 06/17/2019 Elsevier Patient Education  2020 [employer] of the uterus can occur throughout pregnancy, but they are not always a sign that you are in labor. You may have practice contractions called Braxton Hicks contractions. These false labor contractions are sometimes confused with true labor. What are [place] contractions? Braxton Hicks contractions are tightening movements that occur in the muscles of the uterus before labor. Unlike true labor contractions, these contractions do not result in opening (dilation) and thinning of the cervix. Toward the end of pregnancy (32-34 weeks), Braxton Hicks contractions can happen more often and may become stronger. These contractions are sometimes difficult to tell apart from true labor because they can be very uncomfortable. You should not feel embarrassed if you go to the hospital with false labor. Sometimes, the only way to tell if you are in true labor is for your health care provider to look for changes in the cervix. The health care provider  will do a physical exam and may  monitor your contractions. If you are not in true labor, the exam should show that your cervix is not dilating and your water has not broken. If there are no other health problems associated with your pregnancy, it is completely safe for you to be sent home with false labor. You may continue to have Braxton Hicks contractions until you go into true labor. How to tell the difference between true labor and false labor True labor  Contractions last 30-70 seconds.  Contractions become very regular.  Discomfort is usually felt in the top of the uterus, and it spreads to the lower abdomen and low back.  Contractions do not go away with walking.  Contractions usually become more intense and increase in frequency.  The cervix dilates and gets thinner. False labor  Contractions are usually shorter and not as strong as true labor contractions.  Contractions are usually irregular.  Contractions are often felt in the front of the lower abdomen and in the groin.  Contractions may go away when you walk around or change positions while lying down.  Contractions get weaker and are shorter-lasting as time goes on.  The cervix usually does not dilate or become thin. Follow these instructions at home:   Take over-the-counter and prescription medicines only as told by your health care provider.  Keep up with your usual exercises and follow other instructions from your health care provider.  Eat and drink lightly if you think you are going into labor.  If Braxton Hicks contractions are making you uncomfortable: ? Change your position from lying down or resting to walking, or change from walking to resting. ? Sit and rest in a tub of warm water. ? Drink enough fluid to keep your urine pale yellow. Dehydration may cause these contractions. ? Do slow and deep breathing several times an hour.  Keep all follow-up prenatal visits as told by your health care provider. This is important. Contact a  health care provider if:  You have a fever.  You have continuous pain in your abdomen. Get help right away if:  Your contractions become stronger, more regular, and closer together.  You have fluid leaking or gushing from your vagina.  You pass blood-tinged mucus (bloody show).  You have bleeding from your vagina.  You have low back pain that you never had before.  You feel your baby's head pushing down and causing pelvic pressure.  Your baby is not moving inside you as much as it used to. Summary  Contractions that occur before labor are called Braxton Hicks contractions, false labor, or practice contractions.  Braxton Hicks contractions are usually shorter, weaker, farther apart, and less regular than true labor contractions. True labor contractions usually become progressively stronger and regular, and they become more frequent.  Manage discomfort from Braxton Hicks contractions by changing position, resting in a warm bath, drinking plenty of water, or practicing deep breathing. This information is not intended to replace advice given to you by your health care provider. Make sure you discuss any questions you have with your health care provider. Document Revised: 10/10/2017 Document Reviewed: 03/13/2017 Elsevier Patient Education  2020 [employer] Inc[employer]  

## 2020-03-03 NOTE — Progress Notes (Signed)
   PRENATAL VISIT NOTE  Subjective:  Susan Moore is a 19 y.o. G1P0 at [redacted]w[redacted]d being seen today for ongoing prenatal care.  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  Patient reports intermittent pelic pressure.  Contractions: Not present. Vag. Bleeding: None.  Movement: Present. Denies leaking of fluid.   The following portions of the patient's history were reviewed and updated as appropriate: allergies, current medications, past family history, past medical history, past social history, past surgical history and problem list.   Objective:   Vitals:   03/03/20 0855  BP: 127/64  Pulse: 92    Fetal Status:     Movement: Present    FHR not attempted by CMA  General:  Alert, oriented and cooperative. Patient is in no acute distress.  Skin: Skin is warm and dry. No rash noted.   Cardiovascular: Normal heart rate noted  Respiratory: Normal respiratory effort, no problems with respiration noted  Abdomen: Soft, gravid, appropriate for gestational age.  Pain/Pressure: Present     Pelvic: Cervical exam deferred        Extremities: Normal range of motion.  Edema: None  Mental Status: Normal mood and affect. Normal behavior. Normal judgment and thought content.   Assessment and Plan:  Pregnancy: G1P0 at [redacted]w[redacted]d 1. Encounter for supervision of low-risk pregnancy in second trimester - Tdap vaccine greater than or equal to 7yo IM - Unable to obtain GTT since patient is not fasting, will do at next visit, advised of need to fast  2. Uterine size-date discrepancy in third trimester - [place] MFM OB FOLLOW UP; Future  3. Choroid plexus cyst of fetus affecting care of mother, antepartum, single or unspecified fetus - resolved at 73 weeks   4. 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 Mild intermittent asthma without complication - Using inhaler more due to allergies - Advised she can take Zyrtec or Claritin PRN as well   Preterm labor symptoms and general obstetric precautions including but not limited to vaginal bleeding, contractions, leaking of fluid and fetal movement were reviewed in detail with the patient. Please refer to After Visit Summary for other counseling recommendations.   Return in about 2 weeks (around 03/17/2020) for LOB, In-Person, 28 week labs (fasting).  Future Appointments  Date Time Provider [place]  03/17/2020  8:55 AM Virginia Rochester, NP [place]  03/17/2020  9:30 AM [facility]  03/22/2020  3:00 PM [place] [place] 1 [facility]    Kerry Hough, PA-C

## 2020-03-11 DIAGNOSIS — O24419 Gestational diabetes mellitus in pregnancy, unspecified control: Secondary | ICD-10-CM

## 2020-03-11 HISTORY — DX: Gestational diabetes mellitus in pregnancy, unspecified control: O24.419

## 2020-03-13 ENCOUNTER — Inpatient Hospital Stay (HOSPITAL_COMMUNITY)
Admission: EM | Admit: 2020-03-13 | Discharge: 2020-03-14 | Disposition: A | Discharge status: Home / Self Care | Payer: Medicaid Other | Attending: Obstetrics and Gynecology | Admitting: Obstetrics and Gynecology

## 2020-03-13 ENCOUNTER — Other Ambulatory Visit: Payer: Self-pay

## 2020-03-13 ENCOUNTER — Encounter (HOSPITAL_COMMUNITY): Payer: Self-pay

## 2020-03-13 DIAGNOSIS — M545 Low back pain, unspecified: Secondary | ICD-10-CM

## 2020-03-13 DIAGNOSIS — B9689 Other specified bacterial agents as the cause of diseases classified elsewhere: Secondary | ICD-10-CM | POA: Insufficient documentation

## 2020-03-13 DIAGNOSIS — Z3A30 30 weeks gestation of pregnancy: Secondary | ICD-10-CM | POA: Insufficient documentation

## 2020-03-13 DIAGNOSIS — O26893 Other specified pregnancy related conditions, third trimester: Secondary | ICD-10-CM | POA: Insufficient documentation

## 2020-03-13 DIAGNOSIS — Z3689 Encounter for other specified antenatal screening: Secondary | ICD-10-CM

## 2020-03-13 DIAGNOSIS — Z7722 Contact with and (suspected) exposure to environmental tobacco smoke (acute) (chronic): Secondary | ICD-10-CM | POA: Insufficient documentation

## 2020-03-13 DIAGNOSIS — N760 Acute vaginitis: Secondary | ICD-10-CM

## 2020-03-13 DIAGNOSIS — O4700 False labor before 37 completed weeks of gestation, unspecified trimester: Secondary | ICD-10-CM

## 2020-03-13 DIAGNOSIS — O479 False labor, unspecified: Secondary | ICD-10-CM

## 2020-03-13 DIAGNOSIS — O23593 Infection of other part of genital tract in pregnancy, third trimester: Secondary | ICD-10-CM | POA: Insufficient documentation

## 2020-03-13 NOTE — ED Notes (Signed)
Samantha PA to triage to evaluate patient. Report called to Charge @ [facility]. EDP provider called report to [facility] provider. Transport to take patient over.

## 2020-03-13 NOTE — ED Triage Notes (Signed)
Pt states that she is [redacted] weeks pregnant, G1P0, having contractions since yesterday every 2 minutes, denies water breaking or vaginal bleeding.

## 2020-03-13 NOTE — ED Provider Notes (Signed)
MSE was initiated and I personally evaluated the patient and placed orders (if any) at  11:40 PM on Mar 13, 2020.  19 yo G1P0 female approximately [redacted] weeks pregnant presents with complaints of intermittent contractions x 3 days. Tightness sensation every 2 minutes. No urge to push, loss of fluids, or vaginal bleeding.   Patient nontoxic, no apparent distress, vitals are without significant abnormality.  Gravid abdomen.  Patient appears appropriate for MAU transfer.   23:40: CONSULT: Discussed with MAU APP Elmyra Ricks- accepts patient in transfer.     Amaryllis Dyke, PA-C 03/13/20 [id number]    Ward, Delice Bison, DO 03/13/20 2353

## 2020-03-13 NOTE — MAU Note (Signed)
Patient reports abdominal cramping that comes and goes that occur approximately every 2 minutes when they become regular.  Denies LOF/VB.  Endorses + FM.

## 2020-03-14 ENCOUNTER — Encounter (HOSPITAL_COMMUNITY): Payer: Self-pay | Admitting: Obstetrics and Gynecology

## 2020-03-14 DIAGNOSIS — O23593 Infection of other part of genital tract in pregnancy, third trimester: Secondary | ICD-10-CM | POA: Diagnosis not present

## 2020-03-14 DIAGNOSIS — Z7722 Contact with and (suspected) exposure to environmental tobacco smoke (acute) (chronic): Secondary | ICD-10-CM | POA: Diagnosis not present

## 2020-03-14 DIAGNOSIS — Z3A30 30 weeks gestation of pregnancy: Secondary | ICD-10-CM | POA: Diagnosis not present

## 2020-03-14 DIAGNOSIS — M545 Low back pain: Secondary | ICD-10-CM | POA: Diagnosis not present

## 2020-03-14 DIAGNOSIS — O4703 False labor before 37 completed weeks of gestation, third trimester: Secondary | ICD-10-CM

## 2020-03-14 DIAGNOSIS — O26893 Other specified pregnancy related conditions, third trimester: Secondary | ICD-10-CM | POA: Diagnosis not present

## 2020-03-14 DIAGNOSIS — B9689 Other specified bacterial agents as the cause of diseases classified elsewhere: Secondary | ICD-10-CM | POA: Diagnosis not present

## 2020-03-14 LAB — URINALYSIS, ROUTINE W REFLEX MICROSCOPIC
Bacteria, UA: NONE SEEN
Bilirubin Urine: NEGATIVE
Glucose, UA: NEGATIVE mg/dL
Ketones, ur: NEGATIVE mg/dL
Nitrite: NEGATIVE
Protein, ur: 30 mg/dL — AB
RBC / HPF: >50 RBC/hpf — ABNORMAL HIGH (ref 0–5)
Specific Gravity, Urine: 1.026 (ref 1.005–1.030)
pH: 6 (ref 5.0–8.0)

## 2020-03-14 LAB — WET PREP, GENITAL
Sperm: NONE SEEN
Trich, Wet Prep: NONE SEEN
Yeast Wet Prep HPF POC: NONE SEEN

## 2020-03-14 LAB — CULTURE, OB URINE: Culture: NO GROWTH

## 2020-03-14 LAB — FETAL FIBRONECTIN: Fetal Fibronectin: NEGATIVE

## 2020-03-14 MED ORDER — COMFORT FIT MATERNITY SUPP SM MISC: 1.0000 [IU] | Freq: Every day | 0 refills | Status: DC | PRN

## 2020-03-14 MED ORDER — LACTATED RINGERS IV BOLUS
1000.0000 mL | Freq: Once | INTRAVENOUS | Status: AC
  Administered 2020-03-14: 1000 mL via INTRAVENOUS

## 2020-03-14 MED ORDER — METRONIDAZOLE 500 MG PO TABS: 500.0000 mg | ORAL_TABLET | Freq: Two times a day (BID) | ORAL | 0 refills | Status: AC

## 2020-03-14 NOTE — Discharge Instructions (Signed)
Abdominal Pain During Pregnancy  Abdominal pain is common during pregnancy, and has many possible causes. Some causes are more serious than others, and sometimes the cause is not known. Abdominal pain can be a sign that labor is starting. It can also be caused by normal growth and stretching of muscles and ligaments during pregnancy. Always tell your health care provider if you have any abdominal pain. Follow these instructions at home:  Do not have sex or put anything in your vagina until your pain goes away completely.  Get plenty of rest until your pain improves.  Drink enough fluid to keep your urine pale yellow.  Take over-the-counter and prescription medicines only as told by your health care provider.  Keep all follow-up visits as told by your health care provider. This is important. Contact a health care provider if:  Your pain continues or gets worse after resting.  You have lower abdominal pain that: ? Comes and goes at regular intervals. ? Spreads to your back. ? Is similar to menstrual cramps.  You have pain or burning when you urinate. Get help right away if:  You have a fever or chills.  You have vaginal bleeding.  You are leaking fluid from your vagina.  You are passing tissue from your vagina.  You have vomiting or diarrhea that lasts for more than 24 hours.  Your baby is moving less than usual.  You feel very weak or faint.  You have shortness of breath.  You develop severe pain in your upper abdomen. Summary  Abdominal pain is common during pregnancy, and has many possible causes.  If you experience abdominal pain during pregnancy, tell your health care provider right away.  Follow your health care provider's home care instructions and keep all follow-up visits as directed. This information is not intended to replace advice given to you by your health care provider. Make sure you discuss any questions you have with your health care  provider. Document Revised: 02/15/2019 Document Reviewed: 01/30/2017 Elsevier Patient Education  [place].     Preterm Labor and Birth Information  The normal length of a pregnancy is 39-41 weeks. Preterm labor is when labor starts before 37 completed weeks of pregnancy. What are the risk factors for preterm labor? Preterm labor is more likely to occur in women who:  Have certain infections during pregnancy such as a bladder infection, sexually transmitted infection, or infection inside the uterus (chorioamnionitis).  Have a shorter-than-normal cervix.  Have gone into preterm labor before.  Have had surgery on their cervix.  Are younger than age 64 or older than age 30.  Are African American.  Are pregnant with twins or multiple babies (multiple gestation).  Take street drugs or smoke while pregnant.  Do not gain enough weight while pregnant.  Became pregnant shortly after having been pregnant. What are the symptoms of preterm labor? Symptoms of preterm labor include:  Cramps similar to those that can happen during a menstrual period. The cramps may happen with diarrhea.  Pain in the abdomen or lower back.  Regular uterine contractions that may feel like tightening of the abdomen.  A feeling of increased pressure in the pelvis.  Increased watery or bloody mucus discharge from the vagina.  Water breaking (ruptured amniotic sac). Why is it important to recognize signs of preterm labor? It is important to recognize signs of preterm labor because babies who are born prematurely may not be fully developed. This can put them at an increased risk  for:  Long-term (chronic) heart and lung problems.  Difficulty immediately after birth with regulating body systems, including blood sugar, body temperature, heart rate, and breathing rate.  Bleeding in the brain.  Cerebral palsy.  Learning difficulties.  Death. These risks are highest for babies who are born  before 67 weeks of pregnancy. How is preterm labor treated? Treatment depends on the length of your pregnancy, your condition, and the health of your baby. It may involve:  Having a stitch (suture) placed in your cervix to prevent your cervix from opening too early (cerclage).  Taking or being given medicines, such as: ? Hormone medicines. These may be given early in pregnancy to help support the pregnancy. ? Medicine to stop contractions. ? Medicines to help mature the baby's lungs. These may be prescribed if the risk of delivery is high. ? Medicines to prevent your baby from developing cerebral palsy. If the labor happens before 34 weeks of pregnancy, you may need to stay in the hospital. What should I do if I think I am in preterm labor? If you think that you are going into preterm labor, call your health care provider right away. How can I prevent preterm labor in future pregnancies? To increase your chance of having a full-term pregnancy:  Do not use any tobacco products, such as cigarettes, chewing tobacco, and e-cigarettes. If you need help quitting, ask your health care provider.  Do not use street drugs or medicines that have not been prescribed to you during your pregnancy.  Talk with your health care provider before taking any herbal supplements, even if you have been taking them regularly.  Make sure you gain a healthy amount of weight during your pregnancy.  Watch for infection. If you think that you might have an infection, get it checked right away.  Make sure to tell your health care provider if you have gone into preterm labor before. This information is not intended to replace advice given to you by your health care provider. Make sure you discuss any questions you have with your health care provider. Document Revised: 02/19/2019 Document Reviewed: 03/20/2016 Elsevier Patient Education  [place]: You are not alone,  Seventy-five percent of women have some sort of abdominal or back pain at some point in their pregnancy. Your baby is growing at a fast pace, which means that your whole body is rapidly trying to adjust to the changes. As your uterus grows, your back may start feeling a bit under stress and this can result in back or abdominal pain that can go from mild, and therefore bearable, to severe pains that will not allow you to sit or lay down comfortably, When it comes to dealing with pregnancy-related pains and cramps, some pregnant women usually prefer natural remedies, which the market is filled with nowadays. For example, wearing a pregnancy support belt can help ease and lessen your discomfort and pain. WHAT ARE THE BENEFITS OF WEARING A PREGNANCY SUPPORT BELT? A pregnancy support belt provides support to the lower portion of the belly taking some of the weight of the growing uterus and distributing to the other parts of your body. It is designed make you comfortable and gives you extra support. Over the years, the pregnancy apparel market has been studying the needs and wants of pregnant women and they have come up with the most comfortable pregnancy support belts that woman could ever ask for. In fact, you will  no longer have to wear a stretched-out or bulky pregnancy belt that is visible underneath your clothes and makes you feel even more uncomfortable. Nowadays, a pregnancy support belt is made of comfortable and stretchy materials that will not irritate your skin but will actually make you feel at ease and you will not even notice you are wearing it. They are easy to put on and adjust during the day and can be worn at night for additional support.  BENEFITS: . Relives Back pain . Relieves Abdominal Muscle and Leg Pain . Stabilizes the Pelvic Ring . Offers a Cushioned Abdominal Lift Pad . Relieves pressure on the Sciatic Nerve Within Minutes WHERE TO GET YOUR PREGNANCY BELT: [employer] [phone] @[phone]  [place], [place] [id number]         Signs and Symptoms of Labor Labor is your body's natural process of moving your baby, placenta, and umbilical cord out of your uterus. The process of labor usually starts when your baby is full-term, between 18 and 40 weeks of pregnancy. How will I know when I am close to going into labor? As your body prepares for labor and the birth of your baby, you may notice the following symptoms in the weeks and days before true labor starts:  Having a strong desire to get your home ready to receive your new baby. This is called nesting. Nesting may be a sign that labor is approaching, and it may occur several weeks before birth. Nesting may involve cleaning and organizing your home.  Passing a small amount of thick, bloody mucus out of your vagina (normal bloody show or losing your mucus plug). This may happen more than a week before labor begins, or it might occur right before labor begins as the opening of the cervix starts to widen (dilate). For some women, the entire mucus plug passes at once. For others, smaller portions of the mucus plug may gradually pass over several days.  Your baby moving (dropping) lower in your pelvis to get into position for birth (lightening). When this happens, you may feel more pressure on your bladder and pelvic bone and less pressure on your ribs. This may make it easier to breathe. It may also cause you to need to urinate more often and have problems with bowel movements.  Having "practice contractions" (Braxton Hicks contractions) that occur at irregular (unevenly spaced) intervals that are more than 10 minutes apart. This is also called false labor. False labor contractions are common after exercise or sexual activity, and they will stop if you change position, rest, or drink fluids. These contractions are usually mild and do not get stronger over time. They may feel like: ? A backache or back  pain. ? Mild cramps, similar to menstrual cramps. ? Tightening or pressure in your abdomen. Other early symptoms that labor may be starting soon include:  Nausea or loss of appetite.  Diarrhea.  Having a sudden burst of energy, or feeling very tired.  Mood changes.  Having trouble sleeping. How will I know when labor has begun? Signs that true labor has begun may include:  Having contractions that come at regular (evenly spaced) intervals and increase in intensity. This may feel like more intense tightening or pressure in your abdomen that moves to your back. ? Contractions may also feel like rhythmic pain in your upper thighs or back that comes and goes at regular intervals. ? For first-time mothers, this change in intensity of contractions often  occurs at a more gradual pace. ? Women who have given birth before may notice a more rapid progression of contraction changes.  Having a feeling of pressure in the vaginal area.  Your water breaking (rupture of membranes). This is when the sac of fluid that surrounds your baby breaks. When this happens, you will notice fluid leaking from your vagina. This may be clear or blood-tinged. Labor usually starts within 24 hours of your water breaking, but it may take longer to begin. ? Some women notice this as a gush of fluid. ? Others notice that their underwear repeatedly becomes damp. Follow these instructions at home:   When labor starts, or if your water breaks, call your health care provider or nurse care line. Based on your situation, they will determine when you should go in for an exam.  When you are in early labor, you may be able to rest and manage symptoms at home. Some strategies to try at home include: ? Breathing and relaxation techniques. ? Taking a warm bath or shower. ? Listening to music. ? Using a heating pad on the lower back for pain. If you are directed to use heat:  Place a towel between your skin and the heat  source.  Leave the heat on for 20-30 minutes.  Remove the heat if your skin turns bright red. This is especially important if you are unable to feel pain, heat, or cold. You may have a greater risk of getting burned. Get help right away if:  You have painful, regular contractions that are 5 minutes apart or less.  Labor starts before you are [redacted] weeks along in your pregnancy.  You have a fever.  You have a headache that does not go away.  You have bright red blood coming from your vagina.  You do not feel your baby moving.  You have a sudden onset of: ? Severe headache with vision problems. ? Nausea, vomiting, or diarrhea. ? Chest pain or shortness of breath. These symptoms may be an emergency. If your health care provider recommends that you go to the hospital or birth center where you plan to deliver, do not drive yourself. Have someone else drive you, or call emergency services (911 in the [place] Summary  Labor is your body's natural process of moving your baby, placenta, and umbilical cord out of your uterus.  The process of labor usually starts when your baby is full-term, between 39 and 40 weeks of pregnancy.  When labor starts, or if your water breaks, call your health care provider or nurse care line. Based on your situation, they will determine when you should go in for an exam. This information is not intended to replace advice given to you by your health care provider. Make sure you discuss any questions you have with your health care provider. Document Revised: 07/28/2017 Document Reviewed: 04/04/2017 Elsevier Patient Education  [place].       Bacterial Vaginosis  Bacterial vaginosis is a vaginal infection that occurs when the normal balance of bacteria in the vagina is disrupted. It results from an overgrowth of certain bacteria. This is the most common vaginal infection among women ages 103-44. Because bacterial vaginosis increases your risk for STIs  (sexually transmitted infections), getting treated can help reduce your risk for chlamydia, gonorrhea, herpes, and HIV (human immunodeficiency virus). Treatment is also important for preventing complications in pregnant women, because this condition can cause an early (premature) delivery. What are the causes? This condition  is caused by an increase in harmful bacteria that are normally present in small amounts in the vagina. However, the reason that the condition develops is not fully understood. What increases the risk? The following factors may make you more likely to develop this condition:  Having a new sexual partner or multiple sexual partners.  Having unprotected sex.  Douching.  Having an intrauterine device (IUD).  Smoking.  Drug and alcohol abuse.  Taking certain antibiotic medicines.  Being pregnant. You cannot get bacterial vaginosis from toilet seats, bedding, swimming pools, or contact with objects around you. What are the signs or symptoms? Symptoms of this condition include:  Grey or white vaginal discharge. The discharge can also be watery or foamy.  A fish-like odor with discharge, especially after sexual intercourse or during menstruation.  Itching in and around the vagina.  Burning or pain with urination. Some women with bacterial vaginosis have no signs or symptoms. How is this diagnosed? This condition is diagnosed based on:  Your medical history.  A physical exam of the vagina.  Testing a sample of vaginal fluid under a microscope to look for a large amount of bad bacteria or abnormal cells. Your health care provider may use a cotton swab or a small wooden spatula to collect the sample. How is this treated? This condition is treated with antibiotics. These may be given as a pill, a vaginal cream, or a medicine that is put into the vagina (suppository). If the condition comes back after treatment, a second round of antibiotics may be needed. Follow  these instructions at home: Medicines  Take over-the-counter and prescription medicines only as told by your health care provider.  Take or use your antibiotic as told by your health care provider. Do not stop taking or using the antibiotic even if you start to feel better. General instructions  If you have a female sexual partner, tell her that you have a vaginal infection. She should see her health care provider and be treated if she has symptoms. If you have a female sexual partner, he does not need treatment.  During treatment: ? Avoid sexual activity until you finish treatment. ? Do not douche. ? Avoid alcohol as directed by your health care provider. ? Avoid breastfeeding as directed by your health care provider.  Drink enough water and fluids to keep your urine clear or pale yellow.  Keep the area around your vagina and rectum clean. ? Wash the area daily with warm water. ? Wipe yourself from front to back after using the toilet.  Keep all follow-up visits as told by your health care provider. This is important. How is this prevented?  Do not douche.  Wash the outside of your vagina with warm water only.  Use protection when having sex. This includes latex condoms and dental dams.  Limit how many sexual partners you have. To help prevent bacterial vaginosis, it is best to have sex with just one partner (monogamous).  Make sure you and your sexual partner are tested for STIs.  Wear cotton or cotton-lined underwear.  Avoid wearing tight pants and pantyhose, especially during summer.  Limit the amount of alcohol that you drink.  Do not use any products that contain nicotine or tobacco, such as cigarettes and e-cigarettes. If you need help quitting, ask your health care provider.  Do not use illegal drugs. Where to find more information  Centers for Disease Control and Prevention: AppraiserFraud.fi  American Sexual Health Association (ASHA): www.ashastd.org  [place]  Department of Health and Financial controller, Office on Women's Health: DustingSprays.pl or SecuritiesCard.it Contact a health care provider if:  Your symptoms do not improve, even after treatment.  You have more discharge or pain when urinating.  You have a fever.  You have pain in your abdomen.  You have pain during sex.  You have vaginal bleeding between periods. Summary  Bacterial vaginosis is a vaginal infection that occurs when the normal balance of bacteria in the vagina is disrupted.  Because bacterial vaginosis increases your risk for STIs (sexually transmitted infections), getting treated can help reduce your risk for chlamydia, gonorrhea, herpes, and HIV (human immunodeficiency virus). Treatment is also important for preventing complications in pregnant women, because the condition can cause an early (premature) delivery.  This condition is treated with antibiotic medicines. These may be given as a pill, a vaginal cream, or a medicine that is put into the vagina (suppository). This information is not intended to replace advice given to you by your health care provider. Make sure you discuss any questions you have with your health care provider. Document Revised: 10/10/2017 Document Reviewed: 07/13/2016 Elsevier Patient Education  [place].           Abdominal Pain During Pregnancy  Abdominal pain is common during pregnancy, and has many possible causes. Some causes are more serious than others, and sometimes the cause is not known. Abdominal pain can be a sign that labor is starting. It can also be caused by normal growth and stretching of muscles and ligaments during pregnancy. Always tell your health care provider if you have any abdominal pain. Follow these instructions at home:  Do not have sex or put anything in your vagina until your pain goes away completely.  Get plenty of rest until your pain  improves.  Drink enough fluid to keep your urine pale yellow.  Take over-the-counter and prescription medicines only as told by your health care provider.  Keep all follow-up visits as told by your health care provider. This is important. Contact a health care provider if:  Your pain continues or gets worse after resting.  You have lower abdominal pain that: ? Comes and goes at regular intervals. ? Spreads to your back. ? Is similar to menstrual cramps.  You have pain or burning when you urinate. Get help right away if:  You have a fever or chills.  You have vaginal bleeding.  You are leaking fluid from your vagina.  You are passing tissue from your vagina.  You have vomiting or diarrhea that lasts for more than 24 hours.  Your baby is moving less than usual.  You feel very weak or faint.  You have shortness of breath.  You develop severe pain in your upper abdomen. Summary  Abdominal pain is common during pregnancy, and has many possible causes.  If you experience abdominal pain during pregnancy, tell your health care provider right away.  Follow your health care provider's home care instructions and keep all follow-up visits as directed. This information is not intended to replace advice given to you by your health care provider. Make sure you discuss any questions you have with your health care provider. Document Revised: 02/15/2019 Document Reviewed: 01/30/2017 Elsevier Patient Education  [place].

## 2020-03-14 NOTE — MAU Provider Note (Signed)
History     CSN: [id number]  Arrival date and time: 03/13/20 2328   First Provider Initiated Contact with Patient 03/14/20 0036      Chief Complaint  Patient presents with  . Contractions   [name] is a 19 y.o. G1P0 at 38w5dwho presents to MAU for PTL evaluation after contractions began 2 days ago. Patient reports ctx have gotten worse over the past two days. Patient reports contractions feel "real tight" and sometimes are back-to-back, but then she will get a break and then they return. Patient reports ctx are present at this time and reports they are "not bad" and also reports new onset low back pain which started yesterday. Patient denies having a pregnancy support belt. Patient reports last intercourse last month.  Pt denies change in vaginal discharge amount/color/consistency, VB, new onset backache, intermittent abdominal discomfort/pain, pelvic pressure/pain, cramping. Pt denies chest pain and SOB.  Pt denies constipation, diarrhea, or urinary problems. Pt denies fever, chills, fatigue, sweating or changes in appetite. Pt denies dizziness, light-headedness, weakness.  Pt denies VB, LOF and reports good FM.  Current pregnancy problems? 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 O Positive Allergies? NKDA Current medications? PNVs, inhaler [facility] & [facility]? [facility]   OB History    Gravida  1   Para      Term      Preterm      AB      Living  0     SAB      TAB      Ectopic      Multiple      Live Births              Past Medical History:  Diagnosis Date  . Asthma   . Chlamydia     Past Surgical History:  Procedure Laterality Date  . WISDOM TOOTH EXTRACTION      History reviewed. No pertinent family history.  Social History   Tobacco Use  . Smoking status: Passive Smoke Exposure - Never Smoker  . Smokeless tobacco: Never Used  Substance Use Topics  . Alcohol use:  No  . Drug use: Not Currently    Types: Marijuana    Comment: last used when found out pregnant    Allergies: No Known Allergies  Medications Prior to Admission  Medication Sig Dispense Refill Last Dose  . albuterol (VENTOLIN HFA) 108 (90 Base) MCG/ACT inhaler Inhale 1-2 puffs into the lungs every 6 (six) hours as needed for wheezing or shortness of breath. 6.7 g 1 03/13/2020 at 2200  . prenatal vitamin w/FE, FA (PRENATAL 1 + 1) 27-1 MG TABS tablet Take 1 tablet by mouth daily at 12 noon. 30 tablet 11 03/14/2020 at 1230  . albuterol (PROVENTIL) (2.5 MG/3ML) 0.083% nebulizer solution Take 2.5 mg by nebulization every 6 (six) hours as needed for wheezing.   More than a month at Unknown time  . Blood Pressure Monitoring (BLOOD PRESSURE KIT) DEVI 1 Device by Does not apply route as needed. 1 each 0   . docusate sodium (COLACE) 100 MG capsule Take 1 capsule (100 mg total) by mouth 2 (two) times daily. (Patient not taking: Reported on 03/02/2020) 10 capsule 0     Review of Systems  Constitutional: Negative for chills, diaphoresis, fatigue and fever.  Eyes: Negative for visual disturbance.  Respiratory: Negative for shortness of breath.   Cardiovascular: Negative for chest pain.  Gastrointestinal: Positive  for abdominal pain. Negative for constipation, diarrhea, nausea and vomiting.  Genitourinary: Negative for dysuria, flank pain, frequency, pelvic pain, urgency, vaginal bleeding and vaginal discharge.  Musculoskeletal: Positive for back pain.  Neurological: Negative for dizziness, weakness, light-headedness and headaches.   Physical Exam   Blood pressure 120/69, pulse 83, temperature 98.4 F (36.9 C), temperature source Oral, resp. rate 17, weight 82.6 kg, last menstrual period 08/12/2019, SpO2 99 %.  Patient Vitals for the past 24 hrs:  BP Temp Temp src Pulse Resp SpO2 Weight  03/14/20 0321 120/69 -- -- 83 -- -- --  03/14/20 0018 119/69 -- -- (!) 103 -- -- --  03/14/20 0000 105/61 98.4 F  (36.9 C) Oral (!) 103 17 -- 82.6 kg  03/13/20 2345 -- -- -- (!) 101 -- -- --  03/13/20 2336 120/78 98.5 F (36.9 C) Oral -- -- 99 % --   Physical Exam  Constitutional: She is oriented to person, place, and time. She appears well-developed and well-nourished. No distress.  HENT:  Head: Normocephalic and atraumatic.  Respiratory: Effort normal.  GI: Soft.  Genitourinary: There is no rash, tenderness or lesion on the right labia. There is no rash, tenderness or lesion on the left labia.    Genitourinary Comments: Initial CE: long/closed/posterior   Neurological: She is alert and oriented to person, place, and time.  Skin: Skin is warm and dry. She is not diaphoretic.  Psychiatric: She has a normal mood and affect. Her behavior is normal. Judgment and thought content normal.   Results for orders placed or performed during the hospital encounter of 03/13/20 (from the past 24 hour(s))  Urinalysis, Routine w reflex microscopic     Status: Abnormal   Collection Time: 03/14/20 12:12 AM  Result Value Ref Range   Color, Urine YELLOW YELLOW   APPearance HAZY (A) CLEAR   Specific Gravity, Urine 1.026 1.005 - 1.030   pH 6.0 5.0 - 8.0   Glucose, UA NEGATIVE NEGATIVE mg/dL   Hgb urine dipstick SMALL (A) NEGATIVE   Bilirubin Urine NEGATIVE NEGATIVE   Ketones, ur NEGATIVE NEGATIVE mg/dL   Protein, ur 30 (A) NEGATIVE mg/dL   Nitrite NEGATIVE NEGATIVE   Leukocytes,Ua TRACE (A) NEGATIVE   RBC / HPF >50 (H) 0 - 5 RBC/hpf   WBC, UA 0-5 0 - 5 WBC/hpf   Bacteria, UA NONE SEEN NONE SEEN   Squamous Epithelial / LPF 0-5 0 - 5   Mucus PRESENT    Ca Oxalate Crys, UA PRESENT   Wet prep, genital     Status: Abnormal   Collection Time: 03/14/20 12:51 AM  Result Value Ref Range   Yeast Wet Prep HPF POC NONE SEEN NONE SEEN   Trich, Wet Prep NONE SEEN NONE SEEN   Clue Cells Wet Prep HPF POC PRESENT (A) NONE SEEN   WBC, Wet Prep HPF POC MANY (A) NONE SEEN   Sperm NONE SEEN   Fetal fibronectin     Status:  None   Collection Time: 03/14/20 12:51 AM  Result Value Ref Range   Fetal Fibronectin NEGATIVE NEGATIVE    MAU Course  Procedures  MDM -suspect Braxton-Hicks, r/o PTL -initial CE: long/closed/posterior -UA: hazy/sm hgb/30PRO/trace leuks, sending urine for culture -fFN: negative -EFM: reactive       -baseline: 135       -variability: moderate       -accels: present, 15x15       -decels: absent       -TOCO: few ctx -WetPrep: +  ClueCells (will treat based on symptoms) -GC/CT collected -1L LR given, pt reports ctx now resolved, ctx on monitor few, irregular -repeat CE unchanged -pt discharged to home in stable condition  Orders Placed This Encounter  Procedures  . Culture, OB Urine    Standing Status:   Standing    Number of Occurrences:   1  . Wet prep, genital    Standing Status:   Standing    Number of Occurrences:   1  . Urinalysis, Routine w reflex microscopic    Standing Status:   Standing    Number of Occurrences:   1  . Fetal fibronectin    Standing Status:   Standing    Number of Occurrences:   1  . Insert peripheral IV    Standing Status:   Standing    Number of Occurrences:   1  . Discharge patient    Order Specific Question:   Discharge disposition    Answer:   01-Home or Self Care [1]    Order Specific Question:   Discharge patient date    Answer:   03/14/2020   Meds ordered this encounter  Medications  . lactated ringers bolus 1,000 mL  . metroNIDAZOLE (FLAGYL) 500 MG tablet    Sig: Take 1 tablet (500 mg total) by mouth 2 (two) times daily for 7 days.    Dispense:  14 tablet    Refill:  0    Order Specific Question:   Supervising Provider    AnswerDebbrah Alar [id number]  . Elastic Bandages & Supports (COMFORT FIT MATERNITY SUPP SM) MISC    Sig: 1 Units by Does not apply route daily as needed.    Dispense:  1 each    Refill:  0    Order Specific Question:   Supervising Provider    Answer:   Debbrah Alar [[id number]]   Assessment and Plan    1. Preterm contractions   2. Braxton Hicks contractions   3. [redacted] weeks gestation of pregnancy   4. NST (non-stress test) reactive   5. Bacterial vaginosis   6. Low back pain during pregnancy in third trimester    Allergies as of 03/14/2020   No Known Allergies     Medication List    TAKE these medications   albuterol (2.5 MG/3ML) 0.083% nebulizer solution Commonly known as: PROVENTIL Take 2.5 mg by nebulization every 6 (six) hours as needed for wheezing.   albuterol 108 (90 Base) MCG/ACT inhaler Commonly known as: VENTOLIN HFA Inhale 1-2 puffs into the lungs every 6 (six) hours as needed for wheezing or shortness of breath.   Blood Pressure Kit Devi 1 Device by Does not apply route as needed.   Comfort Fit Maternity Supp Sm Misc 1 Units by Does not apply route daily as needed.   docusate sodium 100 MG capsule Commonly known as: Colace Take 1 capsule (100 mg total) by mouth 2 (two) times daily.   metroNIDAZOLE 500 MG tablet Commonly known as: Flagyl Take 1 tablet (500 mg total) by mouth 2 (two) times daily for 7 days.   prenatal vitamin w/FE, FA 27-1 MG Tabs tablet Take 1 tablet by mouth daily at 12 noon.      -will call with culture results, if positive -RX metronidazole -discussed s/sx of PTL -pregnancy support belt instructions/RX given -return MAU precautions given -pt discharged to home in stable condition  Elmyra Ricks E Yurani Fettes 03/14/2020, 3:27 AM

## 2020-03-15 ENCOUNTER — Other Ambulatory Visit: Payer: Self-pay | Admitting: *Deleted

## 2020-03-15 DIAGNOSIS — O339 Maternal care for disproportion, unspecified: Secondary | ICD-10-CM | POA: Diagnosis not present

## 2020-03-15 DIAGNOSIS — Z3490 Encounter for supervision of normal pregnancy, unspecified, unspecified trimester: Secondary | ICD-10-CM

## 2020-03-15 LAB — GC/CHLAMYDIA PROBE AMP (~~LOC~~) NOT AT ARMC
Chlamydia: NEGATIVE
Comment: NEGATIVE
Comment: NORMAL
Neisseria Gonorrhea: NEGATIVE

## 2020-03-17 ENCOUNTER — Encounter: Payer: Medicaid Other | Admitting: Nurse Practitioner

## 2020-03-17 ENCOUNTER — Other Ambulatory Visit: Payer: Medicaid Other

## 2020-03-22 ENCOUNTER — Ambulatory Visit: Payer: Medicaid Other | Attending: Medical

## 2020-03-22 ENCOUNTER — Other Ambulatory Visit: Payer: Self-pay

## 2020-03-22 DIAGNOSIS — Z3A31 31 weeks gestation of pregnancy: Secondary | ICD-10-CM

## 2020-03-22 DIAGNOSIS — O26843 Uterine size-date discrepancy, third trimester: Secondary | ICD-10-CM | POA: Insufficient documentation

## 2020-03-22 DIAGNOSIS — Z148 Genetic carrier of other disease: Secondary | ICD-10-CM | POA: Diagnosis not present

## 2020-03-23 ENCOUNTER — Other Ambulatory Visit: Payer: Medicaid Other

## 2020-03-23 ENCOUNTER — Ambulatory Visit (INDEPENDENT_AMBULATORY_CARE_PROVIDER_SITE_OTHER): Payer: Medicaid Other | Admitting: Obstetrics and Gynecology

## 2020-03-23 DIAGNOSIS — Z3490 Encounter for supervision of normal pregnancy, unspecified, unspecified trimester: Secondary | ICD-10-CM

## 2020-03-23 DIAGNOSIS — R12 Heartburn: Secondary | ICD-10-CM

## 2020-03-23 DIAGNOSIS — O26893 Other specified pregnancy related conditions, third trimester: Secondary | ICD-10-CM

## 2020-03-23 DIAGNOSIS — Z3A32 32 weeks gestation of pregnancy: Secondary | ICD-10-CM

## 2020-03-23 DIAGNOSIS — Z3493 Encounter for supervision of normal pregnancy, unspecified, third trimester: Secondary | ICD-10-CM

## 2020-03-23 MED ORDER — FAMOTIDINE 20 MG PO TABS: 20.0000 mg | ORAL_TABLET | Freq: Two times a day (BID) | ORAL | 1 refills | Status: DC

## 2020-03-23 NOTE — Progress Notes (Signed)
   PRENATAL VISIT NOTE  Subjective:  Susan Moore is a 19 y.o. G1P0 at [redacted]w[redacted]d being seen today for ongoing prenatal care.  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  Patient reports GERD. Burning in upper chest that does not radiate. Has not tried anything OTC.  Symptoms worsen at night.  Contractions: Irregular. Vag. Bleeding: None.  Movement: Present. Denies leaking of fluid.   The following portions of the patient's history were reviewed and updated as appropriate: allergies, current medications, past family history, past medical history, past social history, past surgical history and problem list.   Objective:   Vitals:   03/23/20 0924  BP: 118/71  Pulse: (!) 104  Temp: 98.3 F (36.8 C)  Weight: 182 lb 4.8 oz (82.7 kg)    Fetal Status:   Fundal Height: 32 cm Movement: Present     General:  Alert, oriented and cooperative. Patient is in no acute distress.  Skin: Skin is warm and dry. No rash noted.   Cardiovascular: Normal heart rate noted  Respiratory: Normal respiratory effort, no problems with respiration noted  Abdomen: Soft, gravid, appropriate for gestational age.  Pain/Pressure: Present     Pelvic: Cervical exam deferred        Extremities: Normal range of motion.  Edema: None  Mental Status: Normal mood and affect. Normal behavior. Normal judgment and thought content.   Assessment and Plan:  Pregnancy: G1P0 at [redacted]w[redacted]d   1. Encounter for supervision of low-risk pregnancy in third trimester  Doing well, GERD symptoms Rx: Pepcid, ok to use TUMS Small meals.   Preterm labor symptoms and general obstetric precautions including but not limited to vaginal bleeding, contractions, leaking of fluid and fetal movement were reviewed in detail with the patient.  Please refer to After Visit Summary for other counseling recommendations.   Return in about 2 weeks (around 04/06/2020) for Virtual visit please. .  Future Appointments  Date Time Provider [place]  03/28/2020  8:35 AM Nugent, Gerrie Nordmann, NP [facility] [place], NP

## 2020-03-23 NOTE — Patient Instructions (Signed)

## 2020-03-23 NOTE — Progress Notes (Signed)
Pt had MAU visit on 5/3 d/t UC's. She reports having pain under Rt breast for several months - does not feel like hartburn.  2hr GTT today

## 2020-03-24 LAB — CBC
Hematocrit: 34.6 % (ref 34.0–46.6)
Hemoglobin: 11.4 g/dL (ref 11.1–15.9)
MCH: 29.1 pg (ref 26.6–33.0)
MCHC: 32.9 g/dL (ref 31.5–35.7)
MCV: 88 fL (ref 79–97)
Platelets: 240 10*3/uL (ref 150–450)
RBC: 3.92 x10E6/uL (ref 3.77–5.28)
RDW: 13.6 % (ref 11.7–15.4)
WBC: 10.8 10*3/uL (ref 3.4–10.8)

## 2020-03-24 LAB — GLUCOSE TOLERANCE, 2 HOURS W/ 1HR
Glucose, 1 hour: 166 mg/dL (ref 65–179)
Glucose, 2 hour: 156 mg/dL — ABNORMAL HIGH (ref 65–152)
Glucose, Fasting: 84 mg/dL (ref 65–91)

## 2020-03-24 LAB — RPR: RPR Ser Ql: NONREACTIVE

## 2020-03-24 LAB — HIV ANTIBODY (ROUTINE TESTING W REFLEX): HIV Screen 4th Generation wRfx: NONREACTIVE

## 2020-03-27 ENCOUNTER — Encounter: Payer: Self-pay | Admitting: Medical

## 2020-03-28 ENCOUNTER — Other Ambulatory Visit: Payer: Self-pay

## 2020-03-28 ENCOUNTER — Inpatient Hospital Stay (HOSPITAL_COMMUNITY)
Admission: AD | Admit: 2020-03-28 | Discharge: 2020-03-28 | Disposition: A | Discharge status: Home / Self Care | Payer: Medicaid Other | Attending: Obstetrics and Gynecology | Admitting: Obstetrics and Gynecology

## 2020-03-28 ENCOUNTER — Other Ambulatory Visit: Payer: Self-pay | Admitting: Nurse Practitioner

## 2020-03-28 ENCOUNTER — Encounter: Payer: Self-pay | Admitting: Medical

## 2020-03-28 ENCOUNTER — Encounter (HOSPITAL_COMMUNITY): Payer: Self-pay | Admitting: Obstetrics and Gynecology

## 2020-03-28 ENCOUNTER — Encounter: Payer: Medicaid Other | Admitting: Women's Health

## 2020-03-28 ENCOUNTER — Encounter: Payer: Self-pay | Admitting: Nurse Practitioner

## 2020-03-28 DIAGNOSIS — O24419 Gestational diabetes mellitus in pregnancy, unspecified control: Secondary | ICD-10-CM | POA: Insufficient documentation

## 2020-03-28 DIAGNOSIS — O4703 False labor before 37 completed weeks of gestation, third trimester: Secondary | ICD-10-CM

## 2020-03-28 DIAGNOSIS — O98813 Other maternal infectious and parasitic diseases complicating pregnancy, third trimester: Secondary | ICD-10-CM | POA: Insufficient documentation

## 2020-03-28 DIAGNOSIS — O99891 Other specified diseases and conditions complicating pregnancy: Secondary | ICD-10-CM | POA: Diagnosis not present

## 2020-03-28 DIAGNOSIS — N898 Other specified noninflammatory disorders of vagina: Secondary | ICD-10-CM | POA: Diagnosis present

## 2020-03-28 DIAGNOSIS — O99513 Diseases of the respiratory system complicating pregnancy, third trimester: Secondary | ICD-10-CM | POA: Diagnosis not present

## 2020-03-28 DIAGNOSIS — J45909 Unspecified asthma, uncomplicated: Secondary | ICD-10-CM | POA: Diagnosis not present

## 2020-03-28 DIAGNOSIS — B373 Candidiasis of vulva and vagina: Secondary | ICD-10-CM | POA: Diagnosis not present

## 2020-03-28 DIAGNOSIS — Z3A32 32 weeks gestation of pregnancy: Secondary | ICD-10-CM | POA: Diagnosis not present

## 2020-03-28 DIAGNOSIS — Z79899 Other long term (current) drug therapy: Secondary | ICD-10-CM | POA: Diagnosis not present

## 2020-03-28 DIAGNOSIS — O24410 Gestational diabetes mellitus in pregnancy, diet controlled: Secondary | ICD-10-CM

## 2020-03-28 DIAGNOSIS — O479 False labor, unspecified: Secondary | ICD-10-CM

## 2020-03-28 DIAGNOSIS — B3731 Acute candidiasis of vulva and vagina: Secondary | ICD-10-CM

## 2020-03-28 LAB — WET PREP, GENITAL
Clue Cells Wet Prep HPF POC: NONE SEEN
Sperm: NONE SEEN
Trich, Wet Prep: NONE SEEN

## 2020-03-28 LAB — URINALYSIS, ROUTINE W REFLEX MICROSCOPIC
Bilirubin Urine: NEGATIVE
Glucose, UA: NEGATIVE mg/dL
Ketones, ur: NEGATIVE mg/dL
Nitrite: NEGATIVE
Protein, ur: NEGATIVE mg/dL
Specific Gravity, Urine: 1.015 (ref 1.005–1.030)
pH: 7 (ref 5.0–8.0)

## 2020-03-28 MED ORDER — TERCONAZOLE 0.4 % VA CREA: 1.0000 | TOPICAL_CREAM | Freq: Every day | VAGINAL | 0 refills | Status: DC

## 2020-03-28 NOTE — MAU Note (Signed)
Pt reports being seen 2 weeks ago and was told that she had BV. Pt reports being given medicine to treat it. Pt states she took the medicine, but the discharge continues and is now itchy.   Pt reports intermittent lower abdominal right sided pain.   Denies vaginal bleeding  Denies LOF.   Reports +FM

## 2020-03-28 NOTE — MAU Provider Note (Signed)
Chief Complaint:  Vaginal Discharge and Abdominal Pain   First Provider Initiated Contact with Patient 03/28/20 1625     HPI: Susan Moore is a 19 y.o. G1P0 at 67w5dwho presents to maternity admissions reporting vaginal discharge and abdominal pain.  Discharge has been present for the last 2 weeks, was seen in MAU for this 2 weeks ago and treated for bacterial vaginosis.  Symptoms did not improve after treatment.  Reports thick yellow discharge, with associated itching and irritation.  Has not had intercourse since being evaluated 2 weeks ago.  States she feels like she has a yeast infection.  Reports lower abdominal pain that is intermittent since yesterday.  Occurs less than 5 times an hour but has not been monitoring frequency.  Denies any other symptoms.  Good fetal movement.  Location: abdomen Quality: cramping Severity: 3/10 in pain scale Duration: 1 day Timing: intermittent Modifying factors: none Associated signs and symptoms: vaginal discharge  Pregnancy Course: [facility]  Past Medical History:  Diagnosis Date  . Asthma   . Chlamydia    OB History  Gravida Para Term Preterm AB Living  1         0  SAB TAB Ectopic Multiple Live Births               # Outcome Date GA Lbr Len/2nd Weight Sex Delivery Anes PTL Lv  1 Current            Past Surgical History:  Procedure Laterality Date  . WISDOM TOOTH EXTRACTION     History reviewed. No pertinent family history. Social History   Tobacco Use  . Smoking status: Passive Smoke Exposure - Never Smoker  . Smokeless tobacco: Never Used  Substance Use Topics  . Alcohol use: No  . Drug use: Not Currently    Types: Marijuana    Comment: last used when found out pregnant   No Known Allergies Medications Prior to Admission  Medication Sig Dispense Refill Last Dose  . albuterol (VENTOLIN HFA) 108 (90 Base) MCG/ACT inhaler Inhale 1-2 puffs into the lungs every 6 (six) hours as needed for wheezing or shortness of breath. 6.7 g 1  03/27/2020 at Unknown time  . Blood Pressure Monitoring (BLOOD PRESSURE KIT) DEVI 1 Device by Does not apply route as needed. 1 each 0 Past Month at Unknown time  . Elastic Bandages & Supports (COMFORT FIT MATERNITY SUPP SM) MISC 1 Units by Does not apply route daily as needed. 1 each 0 Past Week at Unknown time  . famotidine (PEPCID) 20 MG tablet Take 1 tablet (20 mg total) by mouth 2 (two) times daily. 90 tablet 1 Past Week at Unknown time  . prenatal vitamin w/FE, FA (PRENATAL 1 + 1) 27-1 MG TABS tablet Take 1 tablet by mouth daily at 12 noon. 30 tablet 11 03/28/2020 at Unknown time  . albuterol (PROVENTIL) (2.5 MG/3ML) 0.083% nebulizer solution Take 2.5 mg by nebulization every 6 (six) hours as needed for wheezing.   More than a month at Unknown time  . docusate sodium (COLACE) 100 MG capsule Take 1 capsule (100 mg total) by mouth 2 (two) times daily. (Patient not taking: Reported on 03/02/2020) 10 capsule 0     I have reviewed patient's Past Medical Hx, Surgical Hx, Family Hx, Social Hx, medications and allergies.   ROS:  Review of Systems  Constitutional: Negative.   Gastrointestinal: Positive for abdominal pain. Negative for constipation, diarrhea, nausea and vomiting.  Genitourinary: Positive for vaginal discharge. Negative  for dysuria and vaginal bleeding.    Physical Exam   Patient Vitals for the past 24 hrs:  BP Temp Pulse Resp SpO2 Weight  03/28/20 1608 120/69 98.4 F (36.9 C) (!) 108 20 100 % --  03/28/20 1557 -- -- -- -- -- 84.9 kg    Constitutional: Well-developed, well-nourished female in no acute distress.  Cardiovascular: normal rate & rhythm, no murmur Respiratory: normal effort, lung sounds clear throughout GI: Abd soft, non-tender, gravid appropriate for gestational age. Pos BS x 4 MS: Extremities nontender, no edema, normal ROM Neurologic: Alert and oriented x 4.  GU:      Pelvic: NEFG, moderate amount of yellow clumpy discharge adherent to vaginal walls & cervix.  No blood.   Dilation: Closed Effacement (%): Thick Cervical Position: Posterior Exam by:: Jorje Guild NP  NST:  Baseline: 150 bpm, Variability: Good {> 6 bpm), Accelerations: Reactive and Decelerations: Absent   Labs: Results for orders placed or performed during the hospital encounter of 03/28/20 (from the past 24 hour(s))  Urinalysis, Routine w reflex microscopic     Status: Abnormal   Collection Time: 03/28/20  4:16 PM  Result Value Ref Range   Color, Urine YELLOW YELLOW   APPearance HAZY (A) CLEAR   Specific Gravity, Urine 1.015 1.005 - 1.030   pH 7.0 5.0 - 8.0   Glucose, UA NEGATIVE NEGATIVE mg/dL   Hgb urine dipstick SMALL (A) NEGATIVE   Bilirubin Urine NEGATIVE NEGATIVE   Ketones, ur NEGATIVE NEGATIVE mg/dL   Protein, ur NEGATIVE NEGATIVE mg/dL   Nitrite NEGATIVE NEGATIVE   Leukocytes,Ua LARGE (A) NEGATIVE   RBC / HPF 21-50 0 - 5 RBC/hpf   WBC, UA 0-5 0 - 5 WBC/hpf   Bacteria, UA FEW (A) NONE SEEN   Squamous Epithelial / LPF 0-5 0 - 5   Mucus PRESENT   Wet prep, genital     Status: Abnormal   Collection Time: 03/28/20  5:01 PM  Result Value Ref Range   Yeast Wet Prep HPF POC PRESENT (A) NONE SEEN   Trich, Wet Prep NONE SEEN NONE SEEN   Clue Cells Wet Prep HPF POC NONE SEEN NONE SEEN   WBC, Wet Prep HPF POC MANY (A) NONE SEEN   Sperm NONE SEEN     Imaging:  No results found.  MAU Course: Orders Placed This Encounter  Procedures  . Wet prep, genital  . Culture, OB Urine  . Urinalysis, Routine w reflex microscopic  . Discharge patient   Meds ordered this encounter  Medications  . terconazole (TERAZOL 7) 0.4 % vaginal cream    Sig: Place 1 applicator vaginally at bedtime. Use for seven days    Dispense:  45 g    Refill:  0    Order Specific Question:   Supervising Provider    Answer:   CONSTANT, PEGGY [[id number]]    MDM: Reactive NST.  No regular contractions. We will collect wet prep.  Patient has not had intercourse since last gonorrhea/chlamydia check  and does not want to be reswabbed for that today.  Spec exam consistent with vaginal yeast & wet prep positive for yeast.  Cervix closed/thick & no contractions on TOCO. Description of symptoms c/w braxton hicks.   Assessment: 1. Yeast vaginitis   2. [redacted] weeks gestation of pregnancy   3. Braxton Hicks contractions     Plan: Discharge home in stable condition.  Rx terazol    Allergies as of 03/28/2020   No Known Allergies  Medication List    STOP taking these medications   docusate sodium 100 MG capsule Commonly known as: Colace     TAKE these medications   albuterol (2.5 MG/3ML) 0.083% nebulizer solution Commonly known as: PROVENTIL Take 2.5 mg by nebulization every 6 (six) hours as needed for wheezing.   albuterol 108 (90 Base) MCG/ACT inhaler Commonly known as: VENTOLIN HFA Inhale 1-2 puffs into the lungs every 6 (six) hours as needed for wheezing or shortness of breath.   Blood Pressure Kit Devi 1 Device by Does not apply route as needed.   Comfort Fit Maternity Supp Sm Misc 1 Units by Does not apply route daily as needed.   famotidine 20 MG tablet Commonly known as: Pepcid Take 1 tablet (20 mg total) by mouth 2 (two) times daily.   prenatal vitamin w/FE, FA 27-1 MG Tabs tablet Take 1 tablet by mouth daily at 12 noon.   terconazole 0.4 % vaginal cream Commonly known as: TERAZOL 7 Place 1 applicator vaginally at bedtime. Use for seven days       Jorje Guild, NP 03/28/2020 5:30 PM

## 2020-03-28 NOTE — Discharge Instructions (Signed)
Vaginal Yeast Infection, Adult  Vaginal yeast infection is a condition that causes vaginal discharge as well as soreness, swelling, and redness (inflammation) of the vagina. This is a common condition. Some women get this infection frequently. What are the causes? This condition is caused by a change in the normal balance of the yeast (candida) and bacteria that live in the vagina. This change causes an overgrowth of yeast, which causes the inflammation. What increases the risk? The condition is more likely to develop in women who:  Take antibiotic medicines.  Have diabetes.  Take birth control pills.  Are pregnant.  Douche often.  Have a weak body defense system (immune system).  Have been taking steroid medicines for a long time.  Frequently wear tight clothing. What are the signs or symptoms? Symptoms of this condition include:  White, thick, creamy vaginal discharge.  Swelling, itching, redness, and irritation of the vagina. The lips of the vagina (vulva) may be affected as well.  Pain or a burning feeling while urinating.  Pain during sex. How is this diagnosed? This condition is diagnosed based on:  Your medical history.  A physical exam.  A pelvic exam. Your health care provider will examine a sample of your vaginal discharge under a microscope. Your health care provider may send this sample for testing to confirm the diagnosis. How is this treated? This condition is treated with medicine. Medicines may be over-the-counter or prescription. You may be told to use one or more of the following:  Medicine that is taken by mouth (orally).  Medicine that is applied as a cream (topically).  Medicine that is inserted directly into the vagina (suppository). Follow these instructions at home:  Lifestyle  Do not have sex until your health care provider approves. Tell your sex partner that you have a yeast infection. That person should go to his or her health care  provider and ask if they should also be treated.  Do not wear tight clothes, such as pantyhose or tight pants.  Wear breathable cotton underwear. General instructions  Take or apply over-the-counter and prescription medicines only as told by your health care provider.  Eat more yogurt. This may help to keep your yeast infection from returning.  Do not use tampons until your health care provider approves.  Try taking a sitz bath to help with discomfort. This is a warm water bath that is taken while you are sitting down. The water should only come up to your hips and should cover your buttocks. Do this 3-4 times per day or as told by your health care provider.  Do not douche.  If you have diabetes, keep your blood sugar levels under control.  Keep all follow-up visits as told by your health care provider. This is important. Contact a health care provider if:  You have a fever.  Your symptoms go away and then return.  Your symptoms do not get better with treatment.  Your symptoms get worse.  You have new symptoms.  You develop blisters in or around your vagina.  You have blood coming from your vagina and it is not your menstrual period.  You develop pain in your abdomen. Summary  Vaginal yeast infection is a condition that causes discharge as well as soreness, swelling, and redness (inflammation) of the vagina.  This condition is treated with medicine. Medicines may be over-the-counter or prescription.  Take or apply over-the-counter and prescription medicines only as told by your health care provider.  Do not douche.   Do not have sex or use tampons until your health care provider approves.  Contact a health care provider if your symptoms do not get better with treatment or your symptoms go away and then return. This information is not intended to replace advice given to you by your health care provider. Make sure you discuss any questions you have with your health care  provider. Document Revised: 05/28/2019 Document Reviewed: 03/16/2018 Elsevier Patient Education  2020 Elsevier Inc.     Braxton Hicks Contractions Contractions of the uterus can occur throughout pregnancy, but they are not always a sign that you are in labor. You may have practice contractions called Braxton Hicks contractions. These false labor contractions are sometimes confused with true labor. What are Braxton Hicks contractions? Braxton Hicks contractions are tightening movements that occur in the muscles of the uterus before labor. Unlike true labor contractions, these contractions do not result in opening (dilation) and thinning of the cervix. Toward the end of pregnancy (32-34 weeks), Braxton Hicks contractions can happen more often and may become stronger. These contractions are sometimes difficult to tell apart from true labor because they can be very uncomfortable. You should not feel embarrassed if you go to the hospital with false labor. Sometimes, the only way to tell if you are in true labor is for your health care provider to look for changes in the cervix. The health care provider will do a physical exam and may monitor your contractions. If you are not in true labor, the exam should show that your cervix is not dilating and your water has not broken. If there are no other health problems associated with your pregnancy, it is completely safe for you to be sent home with false labor. You may continue to have Braxton Hicks contractions until you go into true labor. How to tell the difference between true labor and false labor True labor  Contractions last 30-70 seconds.  Contractions become very regular.  Discomfort is usually felt in the top of the uterus, and it spreads to the lower abdomen and low back.  Contractions do not go away with walking.  Contractions usually become more intense and increase in frequency.  The cervix dilates and gets thinner. False  labor  Contractions are usually shorter and not as strong as true labor contractions.  Contractions are usually irregular.  Contractions are often felt in the front of the lower abdomen and in the groin.  Contractions may go away when you walk around or change positions while lying down.  Contractions get weaker and are shorter-lasting as time goes on.  The cervix usually does not dilate or become thin. Follow these instructions at home:   Take over-the-counter and prescription medicines only as told by your health care provider.  Keep up with your usual exercises and follow other instructions from your health care provider.  Eat and drink lightly if you think you are going into labor.  If Braxton Hicks contractions are making you uncomfortable: ? Change your position from lying down or resting to walking, or change from walking to resting. ? Sit and rest in a tub of warm water. ? Drink enough fluid to keep your urine pale yellow. Dehydration may cause these contractions. ? Do slow and deep breathing several times an hour.  Keep all follow-up prenatal visits as told by your health care provider. This is important. Contact a health care provider if:  You have a fever.  You have continuous pain in your abdomen. Get   help right away if:  Your contractions become stronger, more regular, and closer together.  You have fluid leaking or gushing from your vagina.  You pass blood-tinged mucus (bloody show).  You have bleeding from your vagina.  You have low back pain that you never had before.  You feel your baby's head pushing down and causing pelvic pressure.  Your baby is not moving inside you as much as it used to. Summary  Contractions that occur before labor are called Braxton Hicks contractions, false labor, or practice contractions.  Braxton Hicks contractions are usually shorter, weaker, farther apart, and less regular than true labor contractions. True labor  contractions usually become progressively stronger and regular, and they become more frequent.  Manage discomfort from Braxton Hicks contractions by changing position, resting in a warm bath, drinking plenty of water, or practicing deep breathing. This information is not intended to replace advice given to you by your health care provider. Make sure you discuss any questions you have with your health care provider. Document Revised: 10/10/2017 Document Reviewed: 03/13/2017 Elsevier Patient Education  2020 Elsevier Inc.  

## 2020-03-29 ENCOUNTER — Encounter: Payer: Self-pay | Admitting: Medical

## 2020-03-29 ENCOUNTER — Telehealth (INDEPENDENT_AMBULATORY_CARE_PROVIDER_SITE_OTHER): Payer: Medicaid Other | Admitting: Lactation Services

## 2020-03-29 DIAGNOSIS — O24410 Gestational diabetes mellitus in pregnancy, diet controlled: Secondary | ICD-10-CM

## 2020-03-29 LAB — CULTURE, OB URINE: Culture: <10000 — AB

## 2020-03-29 MED ORDER — ACCU-CHEK GUIDE W/DEVICE KIT: 1.0000 | PACK | Freq: Once | 0 refills | Status: AC

## 2020-03-29 MED ORDER — ACCU-CHEK GUIDE VI STRP: ORAL_STRIP | 12 refills | Status: DC

## 2020-03-29 MED ORDER — ACCU-CHEK SOFTCLIX LANCETS MISC: 12 refills | Status: DC

## 2020-03-29 NOTE — Telephone Encounter (Signed)
-----   Message from Virginia Rochester, NP sent at 03/28/2020 11:40 AM EDT ----- Gestational diabetes.  Please call client and discuss.  Will order nutritional counseling for GDM.

## 2020-03-29 NOTE — Telephone Encounter (Signed)
Called patient to inform her of GTT results and diagnosis og GDM.  Patient was informed she will get a call from the front desk to schedule an appointment for Diabetes Education. Message to front desk to call and schedule patient for Diabetes Education.   Meter and supplies are ordered and patient was instructed to pick up and bring to Diabetes Education appointment.   Patient reports she has no questions or concerns at this time.

## 2020-03-31 ENCOUNTER — Ambulatory Visit: Payer: Medicaid Other

## 2020-03-31 ENCOUNTER — Encounter: Payer: Self-pay | Admitting: Medical

## 2020-04-04 ENCOUNTER — Other Ambulatory Visit: Payer: Self-pay | Admitting: Obstetrics and Gynecology

## 2020-04-04 DIAGNOSIS — J4520 Mild intermittent asthma, uncomplicated: Secondary | ICD-10-CM

## 2020-04-04 MED ORDER — ALBUTEROL SULFATE HFA 108 (90 BASE) MCG/ACT IN AERS: 1.0000 | INHALATION_SPRAY | Freq: Four times a day (QID) | RESPIRATORY_TRACT | 1 refills | Status: DC | PRN

## 2020-04-05 ENCOUNTER — Other Ambulatory Visit: Payer: Self-pay

## 2020-04-05 ENCOUNTER — Encounter: Payer: Medicaid Other | Attending: Nurse Practitioner | Admitting: Dietician

## 2020-04-05 DIAGNOSIS — O24410 Gestational diabetes mellitus in pregnancy, diet controlled: Secondary | ICD-10-CM | POA: Insufficient documentation

## 2020-04-06 ENCOUNTER — Telehealth (INDEPENDENT_AMBULATORY_CARE_PROVIDER_SITE_OTHER): Payer: Medicaid Other | Admitting: Obstetrics and Gynecology

## 2020-04-06 ENCOUNTER — Other Ambulatory Visit: Payer: Self-pay

## 2020-04-06 DIAGNOSIS — Z3493 Encounter for supervision of normal pregnancy, unspecified, third trimester: Secondary | ICD-10-CM

## 2020-04-06 DIAGNOSIS — O24419 Gestational diabetes mellitus in pregnancy, unspecified control: Secondary | ICD-10-CM | POA: Diagnosis not present

## 2020-04-06 DIAGNOSIS — O24410 Gestational diabetes mellitus in pregnancy, diet controlled: Secondary | ICD-10-CM

## 2020-04-06 DIAGNOSIS — Z3A34 34 weeks gestation of pregnancy: Secondary | ICD-10-CM | POA: Diagnosis not present

## 2020-04-06 NOTE — Progress Notes (Signed)
   TELEHEALTH OBSTETRICS VISIT ENCOUNTER NOTE  I connected with Susan Moore on 04/06/20 at  4:15 PM EDT by telephone at home and verified that I am speaking with the correct person using two identifiers.   I discussed the limitations, risks, security and privacy concerns of performing an evaluation and management service by telephone and the availability of in person appointments. I also discussed with the patient that there may be a patient responsible charge related to this service. The patient expressed understanding and agreed to proceed.  Subjective:  Susan Moore is a 19 y.o. G1P0 at [redacted]w[redacted]d being followed for ongoing prenatal care.  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  Patient reports no complaints. Reports fetal movement. Denies any contractions, bleeding or leaking of fluid.   The following portions of the patient's history were reviewed and updated as appropriate: allergies, current medications, past family history, past medical history, past social history, past surgical history and problem list.   Objective:   General:  Alert, oriented and cooperative.   Mental Status: Normal mood and affect perceived. Normal judgment and thought content.  Rest of physical exam deferred due to type of encounter  Assessment and Plan:  Pregnancy: G1P0 at [redacted]w[redacted]d 1. Encounter for supervision of low-risk pregnancy in third trimester  While on the phone patient checked BS 2 hours after last meal and it was 105.  Inconsistent with check BS at home.  BP today 128/69  Lengthy discussion about importance of testing blood sugars, bringing log book and keeping appointments. Discussed risks of uncontrolled DM in pregnancy  including delayed fetal lung maturity, IUFD and shoulder dystocia possibly resulting brachial plexus palsy, brain damage, intrapartum death and extensive obstetric lacerations. Patient verbalizes understanding.   Preterm labor symptoms and general obstetric precautions including but not limited to vaginal bleeding, contractions, leaking of fluid and fetal movement were reviewed in detail with the patient.  I discussed the assessment and treatment plan with the patient. The patient was provided an opportunity to ask questions and all were answered. The patient agreed with the plan and demonstrated an understanding of the instructions. The patient was advised to call back or seek an in-person office evaluation/go to [facility] at [facility] for any urgent or concerning symptoms. Please refer to After Visit Summary for other counseling recommendations.   I provided 12 minutes of non-face-to-face time during this encounter.  No follow-ups on file.  Future Appointments  Date Time Provider [place]  04/20/2020  3:35 PM Lysette Lindenbaum, Artist Pais, NP [facility] [place], NP [facility] [employer] [facility], [place]

## 2020-04-06 NOTE — Patient Instructions (Signed)

## 2020-04-06 NOTE — Progress Notes (Signed)
I connected with  [name] on 04/06/20 at  4:15 PM EDT by telephone and verified that I am speaking with the correct person using two identifiers.   I discussed the limitations, risks, security and privacy concerns of performing an evaluation and management service by telephone and the availability of in person appointments. I also discussed with the patient that there may be a patient responsible charge related to this service. The patient expressed understanding and agreed to proceed.  Derinda Late, RN 04/06/2020  4:14 PM

## 2020-04-07 ENCOUNTER — Encounter: Payer: Self-pay | Admitting: Dietician

## 2020-04-07 ENCOUNTER — Telehealth: Payer: Self-pay | Admitting: Genetic Counselor

## 2020-04-07 NOTE — Progress Notes (Signed)
  Patient was seen on 04/02/2020 for Gestational Diabetes self-management class [facility]. The following learning objectives were met by the patient during this course:   States the definition of Gestational Diabetes  States why dietary management is important in controlling blood glucose  Describes the effects each nutrient has on blood glucose levels  Demonstrates ability to create a balanced meal plan  Demonstrates carbohydrate counting   States when to check blood glucose levels  Demonstrates proper blood glucose monitoring techniques  States the effect of stress and exercise on blood glucose levels  States the importance of limiting caffeine and abstaining from alcohol and smoking  Blood glucose monitor given: none Patient has an Accu Chek meter at home and did not bring this to class.  She is checking her blood glucose 3-4 times daily.  Patient instructed to monitor glucose levels: FBS: 60 - <90 1 hour: <140 2 hour: <120  *Patient received handouts:  Nutrition Diabetes and Pregnancy  Carbohydrate Counting List  Patient will be seen for follow-up as needed.

## 2020-04-07 NOTE — Telephone Encounter (Signed)
I called Susan Moore to give her an update on carrier screening. Unfortunately, I was unable to coordinate carrier screening for Susan Moore since he is incarcerated. I did update Susan Moore that as of this month, spinal muscular atrophy (SMA) has been added to Susan Moore's newborn screen. Her baby will be tested for SMA at birth because of this. We discussed that newborn screening is not always able to pick up on alpha-thalassemia; however, I reminded Susan Moore that the chances of her baby being affected by this condition are low (<1%). Susan Moore confirmed that she had no further questions for me. I wish her the best of luck with her delivery.  Buelah Manis, MS, [facility] Genetic Counselor

## 2020-04-10 ENCOUNTER — Encounter: Payer: Self-pay | Admitting: Medical

## 2020-04-12 ENCOUNTER — Other Ambulatory Visit: Payer: Self-pay

## 2020-04-12 DIAGNOSIS — J4520 Mild intermittent asthma, uncomplicated: Secondary | ICD-10-CM

## 2020-04-12 MED ORDER — ALBUTEROL SULFATE (2.5 MG/3ML) 0.083% IN NEBU: 2.5000 mg | INHALATION_SOLUTION | Freq: Four times a day (QID) | RESPIRATORY_TRACT | 1 refills | Status: DC | PRN

## 2020-04-12 MED ORDER — ALBUTEROL SULFATE HFA 108 (90 BASE) MCG/ACT IN AERS: 1.0000 | INHALATION_SPRAY | Freq: Four times a day (QID) | RESPIRATORY_TRACT | 1 refills | Status: DC | PRN

## 2020-04-17 ENCOUNTER — Encounter: Payer: Self-pay | Admitting: Medical

## 2020-04-20 ENCOUNTER — Other Ambulatory Visit: Payer: Self-pay

## 2020-04-20 ENCOUNTER — Other Ambulatory Visit (HOSPITAL_COMMUNITY)
Admission: RE | Admit: 2020-04-20 | Discharge: 2020-04-20 | Disposition: A | Discharge status: Home / Self Care | Payer: Medicaid Other | Source: Ambulatory Visit | Attending: Women's Health | Admitting: Women's Health

## 2020-04-20 ENCOUNTER — Ambulatory Visit (INDEPENDENT_AMBULATORY_CARE_PROVIDER_SITE_OTHER): Payer: Medicaid Other | Admitting: Obstetrics and Gynecology

## 2020-04-20 VITALS — BP 105/67 | HR 92 | Wt 190.9 lb

## 2020-04-20 DIAGNOSIS — O24410 Gestational diabetes mellitus in pregnancy, diet controlled: Secondary | ICD-10-CM

## 2020-04-20 DIAGNOSIS — Z3493 Encounter for supervision of normal pregnancy, unspecified, third trimester: Secondary | ICD-10-CM | POA: Diagnosis not present

## 2020-04-20 DIAGNOSIS — Z3A36 36 weeks gestation of pregnancy: Secondary | ICD-10-CM

## 2020-04-20 NOTE — Progress Notes (Signed)
BRx glucose:

## 2020-04-20 NOTE — Progress Notes (Signed)
   PRENATAL VISIT NOTE  Subjective:  Susan Moore is a 19 y.o. G1P0 at [redacted]w[redacted]d being seen today for ongoing prenatal care.  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  Patient reports no complaints.  Contractions: Irritability. Vag. Bleeding: None.  Movement: Present. Denies leaking of fluid.   The following portions of the patient's history were reviewed and updated as appropriate: allergies, current medications, past family history, past medical history, past social history, past surgical history and problem list.   Objective:   Vitals:   04/20/20 1611  BP: 105/67  Pulse: 92  Weight: 190 lb 14.4 oz (86.6 kg)    Fetal Status: Fetal Heart Rate (bpm): 148 Fundal Height: 36 cm Movement: Present  Presentation: Undeterminable  General:  Alert, oriented and cooperative. Patient is in no acute distress.  Skin: Skin is warm and dry. No rash noted.   Cardiovascular: Normal heart rate noted  Respiratory: Normal respiratory effort, no problems with respiration noted  Abdomen: Soft, gravid, appropriate for gestational age.  Pain/Pressure: Present     Pelvic: Cervical exam performed in the presence of a chaperone Dilation: Fingertip Effacement (%): Thick Station: -3  Extremities: Normal range of motion.  Edema: Trace  Mental Status: Normal mood and affect. Normal behavior. Normal judgment and thought content.   Assessment and Plan:  Pregnancy: G1P0 at [redacted]w[redacted]d  1. Diet controlled gestational diabetes mellitus (GDM) in third trimester  - [place] MFM OB FOLLOW UP; Future 6/14  2. Encounter for supervision of low-risk pregnancy in third trimester  - GC/Chlamydia probe amp ([place])not at [facility] - Culture, beta strep (group b  only)   Preterm labor symptoms and general obstetric precautions including but not limited to vaginal bleeding, contractions, leaking of fluid and fetal movement were reviewed in detail with the patient. Please refer to After Visit Summary for other counseling recommendations.   Return for Needs [place] for growth ASAP- GDM, 1 week f/u with MD.  No future appointments.  Noni Saupe, NP

## 2020-04-21 LAB — GC/CHLAMYDIA PROBE AMP (~~LOC~~) NOT AT ARMC
Chlamydia: NEGATIVE
Comment: NEGATIVE
Comment: NORMAL
Neisseria Gonorrhea: NEGATIVE

## 2020-04-23 ENCOUNTER — Encounter: Payer: Self-pay | Admitting: Medical

## 2020-04-24 ENCOUNTER — Ambulatory Visit: Payer: Medicaid Other | Attending: Obstetrics and Gynecology

## 2020-04-24 ENCOUNTER — Telehealth: Payer: Self-pay | Admitting: Lactation Services

## 2020-04-24 ENCOUNTER — Encounter: Payer: Self-pay | Admitting: Medical

## 2020-04-24 ENCOUNTER — Other Ambulatory Visit: Payer: Self-pay

## 2020-04-24 DIAGNOSIS — Z148 Genetic carrier of other disease: Secondary | ICD-10-CM | POA: Diagnosis not present

## 2020-04-24 DIAGNOSIS — O26843 Uterine size-date discrepancy, third trimester: Secondary | ICD-10-CM

## 2020-04-24 DIAGNOSIS — O24410 Gestational diabetes mellitus in pregnancy, diet controlled: Secondary | ICD-10-CM | POA: Diagnosis not present

## 2020-04-24 DIAGNOSIS — Z3A36 36 weeks gestation of pregnancy: Secondary | ICD-10-CM | POA: Diagnosis not present

## 2020-04-24 LAB — CULTURE, BETA STREP (GROUP B ONLY): Strep Gp B Culture: NEGATIVE

## 2020-04-24 MED ORDER — TERCONAZOLE 0.4 % VA CREA
1.0000 | TOPICAL_CREAM | Freq: Every day | VAGINAL | 0 refills | Status: DC
Start: 2020-04-24 — End: 2020-05-08

## 2020-04-24 NOTE — Telephone Encounter (Signed)
Patient reports she took the Terazol cream and reports the same itching is back. Terazol Cream reordered and patient to let us know if she is not better after taking. Patient voiced understanding.

## 2020-05-01 ENCOUNTER — Ambulatory Visit (INDEPENDENT_AMBULATORY_CARE_PROVIDER_SITE_OTHER): Payer: Medicaid Other | Admitting: General Practice

## 2020-05-01 ENCOUNTER — Ambulatory Visit (INDEPENDENT_AMBULATORY_CARE_PROVIDER_SITE_OTHER): Payer: Medicaid Other | Admitting: Obstetrics & Gynecology

## 2020-05-01 ENCOUNTER — Other Ambulatory Visit: Payer: Self-pay

## 2020-05-01 ENCOUNTER — Encounter: Payer: Self-pay | Admitting: Obstetrics & Gynecology

## 2020-05-01 ENCOUNTER — Ambulatory Visit: Payer: Self-pay

## 2020-05-01 VITALS — BP 131/57 | HR 122 | Wt 192.0 lb

## 2020-05-01 DIAGNOSIS — O0993 Supervision of high risk pregnancy, unspecified, third trimester: Secondary | ICD-10-CM

## 2020-05-01 DIAGNOSIS — Z3A37 37 weeks gestation of pregnancy: Secondary | ICD-10-CM

## 2020-05-01 DIAGNOSIS — O0990 Supervision of high risk pregnancy, unspecified, unspecified trimester: Secondary | ICD-10-CM

## 2020-05-01 DIAGNOSIS — O24410 Gestational diabetes mellitus in pregnancy, diet controlled: Secondary | ICD-10-CM

## 2020-05-01 DIAGNOSIS — Z3A38 38 weeks gestation of pregnancy: Secondary | ICD-10-CM

## 2020-05-01 MED ORDER — METFORMIN HCL 500 MG PO TABS: 500.0000 mg | ORAL_TABLET | Freq: Two times a day (BID) | ORAL | 11 refills | Status: DC

## 2020-05-01 NOTE — Progress Notes (Signed)
Pt informed that the ultrasound is considered a limited OB ultrasound and is not intended to be a complete ultrasound exam.  Patient also informed that the ultrasound is not being completed with the intent of assessing for fetal or placental anomalies or any pelvic abnormalities.  Explained that the purpose of today's ultrasound is to assess for  presentation and AFI.  Patient acknowledges the purpose of the exam and the limitations of the study.    Koren Bound RN BSN 05/01/20

## 2020-05-01 NOTE — Progress Notes (Signed)
   PRENATAL VISIT NOTE  Subjective:  Susan Moore is a 19 y.o. G1P0 at [redacted]w[redacted]d being seen today for ongoing prenatal care.  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  Patient reports no complaints.  Contractions: Irritability. Vag. Bleeding: None.  Movement: Present. Denies leaking of fluid.   The following portions of the patient's history were reviewed and updated as appropriate: allergies, current medications, past family history, past medical history, past social history, past surgical history and problem list.   Objective:   Vitals:   05/01/20 0932  BP: (!) 131/57  Pulse: (!) 122  Weight: 192 lb (87.1 kg)    Fetal Status: Fetal Heart Rate (bpm): 134   Movement: Present     General:  Alert, oriented and cooperative. Patient is in no acute distress.  Skin: Skin is warm and dry. No rash noted.   Cardiovascular: Normal heart rate noted  Respiratory: Normal respiratory effort, no problems with respiration noted  Abdomen: Soft, gravid, appropriate for gestational age.  Pain/Pressure: Present     Pelvic: Cervical exam deferred        Extremities: Normal range of motion.  Edema: Trace  Mental Status: Normal mood and affect. Normal behavior. Normal judgment and thought content.   Assessment and Plan:  Pregnancy: G1P0 at [redacted]w[redacted]d 1. Diet controlled gestational diabetes mellitus (GDM) in third trimester Glucose log reviewed, 10 elevated postprandial, will start metformin follow up 1 week  2. Supervision of high risk pregnancy, antepartum Will review log, as A2GDM, may need IOL 39-40wks  Term labor symptoms and general obstetric precautions including but not limited to vaginal bleeding, contractions,  leaking of fluid and fetal movement were reviewed in detail with the patient. Please refer to After Visit Summary for other counseling recommendations.   No follow-ups on file.  Future Appointments  Date Time Provider [place]  05/08/2020  8:35 AM Griffin Basil, MD [facility]    [name], MD

## 2020-05-01 NOTE — Patient Instructions (Signed)
Gestational Diabetes Mellitus, Diagnosis Gestational diabetes (gestational diabetes mellitus) is a short-term (temporary) form of diabetes that can happen during pregnancy. It goes away after you give birth. It may be caused by one or both of these problems:  Your pancreas does not make enough of a hormone called insulin.  Your body does not respond in a normal way to insulin that it makes. Insulin lets sugars (glucose) go into cells in the body. This gives you energy. If you have diabetes, sugars cannot get into cells. This causes high blood sugar (hyperglycemia). If you get gestational diabetes, you are:  More likely to get it if you get pregnant again.  More likely to develop type 2 diabetes in the future. If gestational diabetes is treated, it may not hurt you or your baby. Your doctor will set treatment goals for you. In general, you should have these blood sugar levels:  After not eating for a long time (fasting): 95 mg/dL (5.3 mmol/L).  After meals (postprandial): ? One hour after a meal: at or below 140 mg/dL (7.8 mmol/L). ? Two hours after a meal: at or below 120 mg/dL (6.7 mmol/L).  A1c (hemoglobin A1c) level: 6-6.5%. Follow these instructions at home: Questions to ask your doctor   You may want to ask these questions: ? Do I need to meet with a diabetes educator? ? What equipment will I need to care for myself at home? ? What medicines do I need? When should I take them? ? How often do I need to check my blood sugar? ? What number can I call if I have questions? ? When is my next doctor's visit? General instructions  Take over-the-counter and prescription medicines only as told by your doctor.  Stay at a healthy weight during pregnancy.  Keep all follow-up visits as told by your doctor. This is important. Contact a doctor if:  Your blood sugar is at or above 240 mg/dL (13.3 mmol/L).  Your blood sugar is at or above 200 mg/dL (11.1 mmol/L) and you have ketones in  your pee (urine).  You have been sick or have had a fever for 2 days or more and you are not getting better.  You have any of these problems for more than 6 hours: ? You cannot eat or drink. ? You feel sick to your stomach (nauseous). ? You throw up (vomit). ? You have watery poop (diarrhea). Get help right away if:  Your blood sugar is lower than 54 mg/dL (3 mmol/L).  You get confused.  You have trouble: ? Thinking clearly. ? Breathing.  Your baby moves less than normal.  You have any of these: ? Moderate or large ketone levels in your pee. ? Blood coming from your vagina. ? Unusual fluid coming from your vagina. ? Early contractions. These may feel like tightness in your belly. Summary  Gestational diabetes is a short-term form of diabetes. It can happen while you are pregnant. It goes away after you give birth.  If gestational diabetes is treated, it may not hurt you or your baby. Your doctor will set treatment goals for you.  Keep all follow-up visits as told by your doctor. This is important. This information is not intended to replace advice given to you by your health care provider. Make sure you discuss any questions you have with your health care provider. Document Revised: 12/04/2017 Document Reviewed: 12/01/2015 Elsevier Patient Education  2020 Elsevier Inc.  

## 2020-05-03 ENCOUNTER — Encounter: Payer: Self-pay | Admitting: Medical

## 2020-05-08 ENCOUNTER — Ambulatory Visit (INDEPENDENT_AMBULATORY_CARE_PROVIDER_SITE_OTHER): Payer: Medicaid Other | Admitting: Obstetrics and Gynecology

## 2020-05-08 ENCOUNTER — Other Ambulatory Visit: Payer: Self-pay

## 2020-05-08 VITALS — BP 115/74 | HR 102 | Wt 196.0 lb

## 2020-05-08 DIAGNOSIS — O0990 Supervision of high risk pregnancy, unspecified, unspecified trimester: Secondary | ICD-10-CM

## 2020-05-08 DIAGNOSIS — O0993 Supervision of high risk pregnancy, unspecified, third trimester: Secondary | ICD-10-CM

## 2020-05-08 DIAGNOSIS — Z3A38 38 weeks gestation of pregnancy: Secondary | ICD-10-CM

## 2020-05-08 NOTE — Progress Notes (Addendum)
   PRENATAL VISIT NOTE  Subjective:  Susan Moore is a 19 y.o. G1P0 at [redacted]w[redacted]d being seen today for ongoing prenatal care.  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  Patient doing well with no acute concerns today. She reports mild cramping c/w Braxton-Hicks.  Contractions: Irritability. Vag. Bleeding: None.  Movement: Present. Denies leaking of fluid.   Discussed blood sugars.  Pt did not bring results.  States FBS was 90 and postprandials were in 120 range.  The following portions of the patient's history were reviewed and updated as appropriate: allergies, current medications, past family history, past medical history, past social history, past surgical history and problem list. Problem list updated.  Objective:   Vitals:   05/08/20 0904  BP: 115/74  Pulse: (!) 102  Weight: 196 lb (88.9 kg)    Fetal Status: Fetal Heart Rate (bpm): 157 Fundal Height: 38 cm Movement: Present     General:  Alert, oriented and cooperative. Patient is in no acute distress.  Skin: Skin is warm and dry. No rash noted.   Cardiovascular: Normal heart rate noted  Respiratory: Normal respiratory effort, no problems with respiration noted  Abdomen: Soft, gravid, appropriate for gestational age.  Pain/Pressure: Present     Pelvic: Cervical exam performed Dilation: Fingertip Effacement (%): 60 Station: -3  Extremities: Normal range of motion.  Edema: Trace  Mental Status:  Normal mood and affect. Normal behavior. Normal judgment and thought content.   Assessment and Plan:  Pregnancy: G1P0 at [redacted]w[redacted]d  There are no diagnoses linked to this encounter. Term labor symptoms and general obstetric precautions including but not limited  to vaginal bleeding, contractions, leaking of fluid and fetal movement were reviewed in detail with the patient. Pt strongly advised to bring in blood sugars.   Will check growth/AFI at next visit in 1 week Please refer to After Visit Summary for other counseling recommendations.   Return for [facility], in person.   Lynnda Shields, MD

## 2020-05-08 NOTE — Patient Instructions (Signed)

## 2020-05-12 ENCOUNTER — Ambulatory Visit: Payer: Medicaid Other | Attending: Medical

## 2020-05-12 ENCOUNTER — Other Ambulatory Visit: Payer: Self-pay | Admitting: General Practice

## 2020-05-12 ENCOUNTER — Other Ambulatory Visit: Payer: Self-pay

## 2020-05-12 DIAGNOSIS — Z148 Genetic carrier of other disease: Secondary | ICD-10-CM | POA: Diagnosis not present

## 2020-05-12 DIAGNOSIS — O24410 Gestational diabetes mellitus in pregnancy, diet controlled: Secondary | ICD-10-CM | POA: Insufficient documentation

## 2020-05-12 DIAGNOSIS — Z3A39 39 weeks gestation of pregnancy: Secondary | ICD-10-CM | POA: Diagnosis not present

## 2020-05-17 ENCOUNTER — Other Ambulatory Visit: Payer: Self-pay

## 2020-05-17 ENCOUNTER — Encounter: Payer: Self-pay | Admitting: Obstetrics & Gynecology

## 2020-05-17 ENCOUNTER — Ambulatory Visit (INDEPENDENT_AMBULATORY_CARE_PROVIDER_SITE_OTHER): Payer: Medicaid Other | Admitting: Obstetrics & Gynecology

## 2020-05-17 VITALS — BP 130/69 | HR 111 | Wt 195.1 lb

## 2020-05-17 DIAGNOSIS — O24415 Gestational diabetes mellitus in pregnancy, controlled by oral hypoglycemic drugs: Secondary | ICD-10-CM

## 2020-05-17 DIAGNOSIS — Z3A39 39 weeks gestation of pregnancy: Secondary | ICD-10-CM

## 2020-05-17 DIAGNOSIS — O0990 Supervision of high risk pregnancy, unspecified, unspecified trimester: Secondary | ICD-10-CM

## 2020-05-17 DIAGNOSIS — O0993 Supervision of high risk pregnancy, unspecified, third trimester: Secondary | ICD-10-CM

## 2020-05-17 NOTE — Patient Instructions (Signed)

## 2020-05-17 NOTE — Progress Notes (Signed)
° °  PRENATAL VISIT NOTE  Subjective:  Susan Moore is a 19 y.o. G1P0 at [redacted]w[redacted]d being seen today for ongoing prenatal care.  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  Patient reports no complaints.  Contractions: Irritability. Vag. Bleeding: None.  Movement: Present. Denies leaking of fluid.   The following portions of the patient's history were reviewed and updated as appropriate: allergies, current medications, past family history, past medical history, past social history, past surgical history and problem list.   Objective:   Vitals:   05/17/20 1317  BP: 130/69  Pulse: (!) 111  Weight: 195 lb 1.6 oz (88.5 kg)    Fetal Status: Fetal Heart Rate (bpm): 162   Movement: Present  Presentation: Vertex  General:  Alert, oriented and cooperative. Patient is in no acute distress.  Skin: Skin is warm and dry. No rash noted.   Cardiovascular: Normal heart rate noted  Respiratory: Normal respiratory effort, no problems with respiration noted  Abdomen: Soft, gravid, appropriate for gestational age.  Pain/Pressure: Present     Pelvic: Cervical exam performed in the presence of a chaperone Dilation: Fingertip Effacement (%): 70 Station: -3  Extremities: Normal range of motion.  Edema: Trace  Mental Status: Normal mood and affect. Normal behavior. Normal judgment and thought content.   Assessment and Plan:  Pregnancy: G1P0 at [redacted]w[redacted]d 1. Gestational diabetes mellitus (GDM) in third trimester  2. [redacted] weeks gestation of pregnancy 3. Supervision of high risk pregnancy, antepartum Sugars improved, +fetal movement, unable to do fetal testing today (not scheduled).  Scheduled for IOL tomorrow early morning.  Declined foley  induction today. Preterm labor symptoms and general obstetric precautions including but not limited to vaginal bleeding, contractions, leaking of fluid and fetal movement were reviewed in detail with the patient. Please refer to After Visit Summary for other counseling recommendations.   Return for Postpartum check.  Future Appointments  Date Time Provider [place]  05/18/2020  7:15 AM [facility] SCHED ROOM [facility] None    Verita Schneiders, MD

## 2020-05-18 ENCOUNTER — Encounter (HOSPITAL_COMMUNITY): Payer: Self-pay | Admitting: Obstetrics & Gynecology

## 2020-05-18 ENCOUNTER — Inpatient Hospital Stay (HOSPITAL_COMMUNITY): Payer: Medicaid Other | Admitting: Anesthesiology

## 2020-05-18 ENCOUNTER — Inpatient Hospital Stay (HOSPITAL_COMMUNITY): Payer: Medicaid Other

## 2020-05-18 ENCOUNTER — Inpatient Hospital Stay (HOSPITAL_COMMUNITY)
Admission: AD | Admit: 2020-05-18 | Discharge: 2020-05-22 | DRG: 786 | Disposition: A | Discharge status: Home / Self Care | Payer: Medicaid Other | Attending: Obstetrics and Gynecology | Admitting: Obstetrics and Gynecology

## 2020-05-18 DIAGNOSIS — O24415 Gestational diabetes mellitus in pregnancy, controlled by oral hypoglycemic drugs: Secondary | ICD-10-CM | POA: Diagnosis present

## 2020-05-18 DIAGNOSIS — Z7722 Contact with and (suspected) exposure to environmental tobacco smoke (acute) (chronic): Secondary | ICD-10-CM | POA: Diagnosis present

## 2020-05-18 DIAGNOSIS — O411230 Chorioamnionitis, third trimester, not applicable or unspecified: Secondary | ICD-10-CM | POA: Diagnosis present

## 2020-05-18 DIAGNOSIS — O350XX0 Maternal care for (suspected) central nervous system malformation in fetus, not applicable or unspecified: Secondary | ICD-10-CM | POA: Diagnosis present

## 2020-05-18 DIAGNOSIS — Z148 Genetic carrier of other disease: Secondary | ICD-10-CM

## 2020-05-18 DIAGNOSIS — D259 Leiomyoma of uterus, unspecified: Secondary | ICD-10-CM | POA: Diagnosis present

## 2020-05-18 DIAGNOSIS — O0990 Supervision of high risk pregnancy, unspecified, unspecified trimester: Secondary | ICD-10-CM

## 2020-05-18 DIAGNOSIS — J45909 Unspecified asthma, uncomplicated: Secondary | ICD-10-CM | POA: Diagnosis present

## 2020-05-18 DIAGNOSIS — Z20822 Contact with and (suspected) exposure to covid-19: Secondary | ICD-10-CM | POA: Diagnosis present

## 2020-05-18 DIAGNOSIS — O358XX0 Maternal care for other (suspected) fetal abnormality and damage, not applicable or unspecified: Secondary | ICD-10-CM | POA: Diagnosis not present

## 2020-05-18 DIAGNOSIS — Z3A40 40 weeks gestation of pregnancy: Secondary | ICD-10-CM | POA: Diagnosis not present

## 2020-05-18 DIAGNOSIS — D563 Thalassemia minor: Secondary | ICD-10-CM | POA: Diagnosis present

## 2020-05-18 DIAGNOSIS — O3413 Maternal care for benign tumor of corpus uteri, third trimester: Secondary | ICD-10-CM | POA: Diagnosis present

## 2020-05-18 DIAGNOSIS — O24419 Gestational diabetes mellitus in pregnancy, unspecified control: Secondary | ICD-10-CM | POA: Diagnosis present

## 2020-05-18 DIAGNOSIS — O411290 Chorioamnionitis, unspecified trimester, not applicable or unspecified: Secondary | ICD-10-CM | POA: Diagnosis not present

## 2020-05-18 DIAGNOSIS — O24425 Gestational diabetes mellitus in childbirth, controlled by oral hypoglycemic drugs: Secondary | ICD-10-CM | POA: Diagnosis not present

## 2020-05-18 DIAGNOSIS — O3503X0 Maternal care for (suspected) central nervous system malformation or damage in fetus, choroid plexus cysts, not applicable or unspecified: Secondary | ICD-10-CM | POA: Diagnosis present

## 2020-05-18 DIAGNOSIS — O9952 Diseases of the respiratory system complicating childbirth: Secondary | ICD-10-CM | POA: Diagnosis not present

## 2020-05-18 HISTORY — DX: Gestational diabetes mellitus in pregnancy, controlled by oral hypoglycemic drugs: O24.415

## 2020-05-18 LAB — COMPREHENSIVE METABOLIC PANEL
ALT: 16 U/L (ref 0–44)
AST: 25 U/L (ref 15–41)
Albumin: 2.6 g/dL — ABNORMAL LOW (ref 3.5–5.0)
Alkaline Phosphatase: 205 U/L — ABNORMAL HIGH (ref 38–126)
Anion gap: 11 (ref 5–15)
BUN: 8 mg/dL (ref 6–20)
CO2: 20 mmol/L — ABNORMAL LOW (ref 22–32)
Calcium: 8.9 mg/dL (ref 8.9–10.3)
Chloride: 105 mmol/L (ref 98–111)
Creatinine, Ser: 0.84 mg/dL (ref 0.44–1.00)
GFR calc Af Amer: >60 mL/min (ref >60)
GFR calc non Af Amer: >60 mL/min (ref >60)
Glucose, Bld: 138 mg/dL — ABNORMAL HIGH (ref 70–99)
Potassium: 3.8 mmol/L (ref 3.5–5.1)
Sodium: 136 mmol/L (ref 135–145)
Total Bilirubin: 0.3 mg/dL (ref 0.3–1.2)
Total Protein: 6.6 g/dL (ref 6.5–8.1)

## 2020-05-18 LAB — CBC
HCT: 37.9 % (ref 36.0–46.0)
Hemoglobin: 11.9 g/dL — ABNORMAL LOW (ref 12.0–15.0)
MCH: 28.2 pg (ref 26.0–34.0)
MCHC: 31.4 g/dL (ref 30.0–36.0)
MCV: 89.8 fL (ref 80.0–100.0)
Platelets: 214 10*3/uL (ref 150–400)
RBC: 4.22 MIL/uL (ref 3.87–5.11)
RDW: 15 % (ref 11.5–15.5)
WBC: 7.5 10*3/uL (ref 4.0–10.5)
nRBC: 0 % (ref 0.0–0.2)

## 2020-05-18 LAB — SARS CORONAVIRUS 2 BY RT PCR (HOSPITAL ORDER, PERFORMED IN ~~LOC~~ HOSPITAL LAB): SARS Coronavirus 2: NEGATIVE

## 2020-05-18 LAB — TYPE AND SCREEN
ABO/RH(D): O POS
Antibody Screen: NEGATIVE

## 2020-05-18 LAB — GLUCOSE, CAPILLARY
Glucose-Capillary: 83 mg/dL (ref 70–99)
Glucose-Capillary: 109 mg/dL — ABNORMAL HIGH (ref 70–99)
Glucose-Capillary: 109 mg/dL — ABNORMAL HIGH (ref 70–99)

## 2020-05-18 LAB — ABO/RH: ABO/RH(D): O POS

## 2020-05-18 LAB — RPR: RPR Ser Ql: NONREACTIVE

## 2020-05-18 MED ORDER — OXYTOCIN-SODIUM CHLORIDE 30-0.9 UT/500ML-% IV SOLN
1.0000 m[IU]/min | INTRAVENOUS | Status: DC
  Administered 2020-05-18: 2 m[IU]/min via INTRAVENOUS
  Filled 2020-05-18: qty 500

## 2020-05-18 MED ORDER — LACTATED RINGERS IV SOLN
500.0000 mL | INTRAVENOUS | Status: DC | PRN
  Administered 2020-05-19 (×2): 500 mL via INTRAVENOUS

## 2020-05-18 MED ORDER — SOD CITRATE-CITRIC ACID 500-334 MG/5ML PO SOLN
30.0000 mL | ORAL | Status: DC | PRN
  Administered 2020-05-19: 30 mL via ORAL
  Filled 2020-05-18: qty 30

## 2020-05-18 MED ORDER — LACTATED RINGERS IV SOLN: INTRAVENOUS | Status: DC

## 2020-05-18 MED ORDER — MISOPROSTOL 50MCG HALF TABLET
ORAL_TABLET | ORAL | Status: AC
  Filled 2020-05-18: qty 1

## 2020-05-18 MED ORDER — LIDOCAINE HCL (PF) 1 % IJ SOLN
INTRAMUSCULAR | Status: DC | PRN
  Administered 2020-05-18 (×2): 5 mL via EPIDURAL

## 2020-05-18 MED ORDER — OXYCODONE-ACETAMINOPHEN 5-325 MG PO TABS: 2.0000 | ORAL_TABLET | ORAL | Status: DC | PRN

## 2020-05-18 MED ORDER — OXYTOCIN BOLUS FROM INFUSION: 333.0000 mL | Freq: Once | INTRAVENOUS | Status: DC

## 2020-05-18 MED ORDER — MISOPROSTOL 25 MCG QUARTER TABLET
25.0000 ug | ORAL_TABLET | ORAL | Status: DC | PRN
  Administered 2020-05-18: 25 ug via VAGINAL
  Filled 2020-05-18 (×2): qty 1

## 2020-05-18 MED ORDER — FLEET ENEMA 7-19 GM/118ML RE ENEM: 1.0000 | ENEMA | Freq: Every day | RECTAL | Status: DC | PRN

## 2020-05-18 MED ORDER — MISOPROSTOL 50MCG HALF TABLET
50.0000 ug | ORAL_TABLET | ORAL | Status: DC
  Administered 2020-05-18: 50 ug via ORAL

## 2020-05-18 MED ORDER — OXYTOCIN-SODIUM CHLORIDE 30-0.9 UT/500ML-% IV SOLN: 2.5000 [IU]/h | INTRAVENOUS | Status: DC

## 2020-05-18 MED ORDER — HYDROXYZINE HCL 50 MG PO TABS: 50.0000 mg | ORAL_TABLET | Freq: Four times a day (QID) | ORAL | Status: DC | PRN

## 2020-05-18 MED ORDER — FENTANYL-BUPIVACAINE-NACL 0.5-0.125-0.9 MG/250ML-% EP SOLN
12.0000 mL/h | EPIDURAL | Status: DC | PRN
  Administered 2020-05-19: 12 mL/h via EPIDURAL
  Filled 2020-05-18 (×2): qty 250

## 2020-05-18 MED ORDER — PHENYLEPHRINE 40 MCG/ML (10ML) SYRINGE FOR IV PUSH (FOR BLOOD PRESSURE SUPPORT): 80.0000 ug | PREFILLED_SYRINGE | INTRAVENOUS | Status: DC | PRN

## 2020-05-18 MED ORDER — FENTANYL CITRATE (PF) 100 MCG/2ML IJ SOLN: 50.0000 ug | INTRAMUSCULAR | Status: DC | PRN

## 2020-05-18 MED ORDER — LIDOCAINE HCL (PF) 1 % IJ SOLN: 30.0000 mL | INTRAMUSCULAR | Status: DC | PRN

## 2020-05-18 MED ORDER — ZOLPIDEM TARTRATE 5 MG PO TABS: 5.0000 mg | ORAL_TABLET | Freq: Every evening | ORAL | Status: DC | PRN

## 2020-05-18 MED ORDER — ACETAMINOPHEN 325 MG PO TABS
650.0000 mg | ORAL_TABLET | ORAL | Status: DC | PRN
  Administered 2020-05-19: 650 mg via ORAL
  Filled 2020-05-18: qty 2

## 2020-05-18 MED ORDER — LACTATED RINGERS IV SOLN
500.0000 mL | Freq: Once | INTRAVENOUS | Status: AC
  Administered 2020-05-18: 500 mL via INTRAVENOUS

## 2020-05-18 MED ORDER — SODIUM CHLORIDE (PF) 0.9 % IJ SOLN
INTRAMUSCULAR | Status: DC | PRN
  Administered 2020-05-18: 10 mL/h via EPIDURAL

## 2020-05-18 MED ORDER — DIPHENHYDRAMINE HCL 50 MG/ML IJ SOLN: 12.5000 mg | INTRAMUSCULAR | Status: DC | PRN

## 2020-05-18 MED ORDER — TERBUTALINE SULFATE 1 MG/ML IJ SOLN: 0.2500 mg | Freq: Once | INTRAMUSCULAR | Status: DC | PRN

## 2020-05-18 MED ORDER — EPHEDRINE 5 MG/ML INJ: 10.0000 mg | INTRAVENOUS | Status: DC | PRN

## 2020-05-18 MED ORDER — ONDANSETRON HCL 4 MG/2ML IJ SOLN: 4.0000 mg | Freq: Four times a day (QID) | INTRAMUSCULAR | Status: DC | PRN

## 2020-05-18 MED ORDER — OXYCODONE-ACETAMINOPHEN 5-325 MG PO TABS: 1.0000 | ORAL_TABLET | ORAL | Status: DC | PRN

## 2020-05-18 NOTE — Progress Notes (Signed)
Susan Moore is a 19 y.o. G1P0 at [redacted]w[redacted]d admitted for induction of labor due to [facility].  Subjective: Patient is feeling strong contractions but the pain is well tolerated. She is feeling good fetal movement. Discussed continuation of Pitocin and possible need for AROM, patient is agreeable.   Objective: BP 136/70    Pulse 89    Temp 98.1 F (36.7 C) (Axillary)    Resp 16    Ht 5\' 3"  (1.6 m)    Wt 88.5 kg    LMP 08/12/2019    BMI 34.56 kg/m  No intake/output data recorded.  FHT:  FHR: 130-135 bpm, variability: moderate,  accelerations:  Present,  decelerations:  Absent UC:   irregular, every 2-5 minutes  SVE:   Dilation: 4 Effacement (%): 80 Station: -3 Exam by:: lee  Pitocin @ 4 mu/min  Labs: Lab Results  Component Value Date   WBC 7.5 05/18/2020   HGB 11.9 (L) 05/18/2020   HCT 37.9 05/18/2020   MCV 89.8 05/18/2020   PLT 214 05/18/2020    Assessment / Plan: Susan Moore is a 19 y.o. G1P0 at 102w0d here for IOL secondary to [facility] on Metformin.  Labor: IOL, FB out, Pitocin 1654. Fetal Wellbeing:  Category I Pain Control:  Per patient request, plans epidural I/D:  GBS negative Anticipated MOD:  NSVD  GDMA2: CBG 83 at 1240. CBG q4h latent labor, q2h active labor     Rise Patience DO PGY1, Family Medicine Resident 05/18/2020, 5:41 PM

## 2020-05-18 NOTE — Progress Notes (Signed)
Susan Moore is a 19 y.o. G1P0 at [redacted]w[redacted]d admitted for induction of labor due to [facility].  Subjective: Patient is doing well, will request epidural when appropriate.   Objective: BP 128/81   Pulse 95   Temp 98.1 F (36.7 C) (Axillary)   Resp 16   Ht 5\' 3"  (1.6 m)   Wt 88.5 kg   LMP 08/12/2019   BMI 34.56 kg/m  No intake/output data recorded.  FHT:  FHR: 125-130 bpm, variability: moderate,  accelerations:  Present,  decelerations:  Absent UC:   irregular, every 2-5 minutes  SVE:   Dilation: Fingertip Station: -3 Exam by:: lee   Labs: Lab Results  Component Value Date   WBC 7.5 05/18/2020   HGB 11.9 (L) 05/18/2020   HCT 37.9 05/18/2020   MCV 89.8 05/18/2020   PLT 214 05/18/2020    Assessment / Plan: Susan Moore is a 19 y.o. G1P0 at [redacted]w[redacted]d here for IOL secondary to [facility] on Metformin.  Labor: IOL, FB placed and a second dose of misoprostol given. Fetal Wellbeing:  Category I Pain Control:  Per patient request, plans epidural I/D:  GBS negative Anticipated MOD:  NSVD  GDMA2: CBG 83 at 1240. CBG q4h latent labor, q2h active labor   Rise Patience DO PGY1, Family Medicine Resident 05/18/2020, 12:54 PM

## 2020-05-18 NOTE — Progress Notes (Signed)
Ctx getting stronger per pt, q 2-4 minutes. cx 5/80/-2 per Roxanne Gates, SNM.  Pitocin at 8 mu/min. BS 103.  FHR Cat 1. Titrate pitocin up until labor adequate.

## 2020-05-18 NOTE — Anesthesia Procedure Notes (Signed)
Epidural Patient location during procedure: OB Start time: 05/18/2020 11:31 PM End time: 05/18/2020 11:47 PM  Staffing Anesthesiologist: Duane Boston, MD Performed: anesthesiologist   Preanesthetic Checklist Completed: patient identified, IV checked, site marked, risks and benefits discussed, monitors and equipment checked, pre-op evaluation and timeout performed  Epidural Patient position: sitting Prep: DuraPrep Patient monitoring: heart rate, cardiac monitor, continuous pulse ox and blood pressure Approach: midline Location: L2-L3 Injection technique: LOR saline  Needle:  Needle type: Tuohy  Needle gauge: 17 G Needle length: 9 cm Needle insertion depth: 6 cm Catheter size: 20 Guage Catheter at skin depth: 11 cm Test dose: negative and Other  Assessment Events: blood not aspirated, injection not painful, no injection resistance and negative IV test  Additional Notes Informed consent obtained prior to proceeding including risk of failure, 1% risk of PDPH, risk of minor discomfort and bruising.  Discussed rare but serious complications including epidural abscess, permanent nerve injury, epidural hematoma.  Discussed alternatives to epidural analgesia and patient desires to proceed.  Timeout performed pre-procedure verifying patient name, procedure, and platelet count.  Patient tolerated procedure well.

## 2020-05-18 NOTE — H&P (Addendum)
OBSTETRIC ADMISSION HISTORY AND PHYSICAL  Susan Moore is a 19 y.o. female G1P0 with IUP at 75w0dpresenting [facility] She reports +FMs. No LOF, VB, blurry vision, headaches, peripheral edema, or RUQ pain. She plans on breast feeding. She requests Depo for birth control. She is requesting an epidural.  Dating: By LMP c/w 21 wk [place]--->  Estimated Date of Delivery: 05/18/20  Sono:    '@[redacted]w[redacted]d' , normal anatomy, cephalic presentation, 689%VQX EFW 3066g   Prenatal History/Complications: --Asthma --GDMA2 in 3rd Trimester --Alpha Thalassemia carrier --Choroid plexus cyst of fetus affecting care of mother  Past Medical History: Past Medical History:  Diagnosis Date   Asthma    Chlamydia    GDM (gestational diabetes mellitus) 03/2020    Past Surgical History: Past Surgical History:  Procedure Laterality Date   WISDOM TOOTH EXTRACTION      Obstetrical History: OB History    Gravida  1   Para      Term      Preterm      AB      Living  0     SAB      TAB      Ectopic      Multiple      Live Births              Social History: Social History   Socioeconomic History   Marital status: Single    Spouse name: Not on file   Number of children: Not on file   Years of education: Not on file   Highest education level: Not on file  Occupational History   Not on file  Tobacco Use   Smoking status: Passive Smoke Exposure - Never Smoker   Smokeless tobacco: Never Used  Vaping Use   Vaping Use: Never used  Substance and Sexual Activity   Alcohol use: No   Drug use: Not Currently    Types: Marijuana    Comment: last used when found out pregnant   Sexual activity: Not Currently    Birth control/protection: None  Other Topics Concern   Not on file  Social History Narrative   Not on file   Social Determinants of Health   Financial Resource Strain:    Difficulty of Paying Living Expenses:   Food Insecurity: No Food  Insecurity   Worried About Running Out of Food in the Last Year: Never true   [place] the Last [place]: Never true  Transportation Needs: No Transportation Needs   Lack of Transportation (Medical): No   Lack of Transportation (Non-Medical): No  Physical Activity:    Days of Exercise per Week:    Minutes of Exercise per Session:   Stress:    Feeling of Stress :   Social Connections:    Frequency of Communication with Friends and Family:    Frequency of Social Gatherings with Friends and Family:    Attends Religious Services:    Active Member of Clubs or Organizations:    Attends CArchivistMeetings:    Marital Status:     Family History: No family history on file.  Allergies: No Known Allergies  Medications Prior to Admission  Medication Sig Dispense Refill Last Dose   Accu-Chek Softclix Lancets lancets To check blood sugar 4 times a day as instructed. 100 each 12    albuterol (PROVENTIL) (2.5 MG/3ML) 0.083% nebulizer solution Take 3 mLs (2.5 mg total) by nebulization every 6 (six) hours as  needed for wheezing. 75 mL 1    albuterol (VENTOLIN HFA) 108 (90 Base) MCG/ACT inhaler Inhale 1-2 puffs into the lungs every 6 (six) hours as needed for wheezing or shortness of breath. 6.7 g 1    Blood Pressure Monitoring (BLOOD PRESSURE KIT) DEVI 1 Device by Does not apply route as needed. 1 each 0    Elastic Bandages & Supports (COMFORT FIT MATERNITY SUPP SM) MISC 1 Units by Does not apply route daily as needed. 1 each 0    famotidine (PEPCID) 20 MG tablet Take 1 tablet (20 mg total) by mouth 2 (two) times daily. 90 tablet 1    glucose blood (ACCU-CHEK GUIDE) test strip To check blood sugars 4 times a day. Fasting, and 2 hours after Breakfast, Lunch and Dinner 100 each 12    metFORMIN (GLUCOPHAGE) 500 MG tablet Take 1 tablet (500 mg total) by mouth 2 (two) times daily with a meal. 60 tablet 11    prenatal vitamin w/FE, FA (PRENATAL 1 + 1) 27-1 MG TABS  tablet Take 1 tablet by mouth daily at 12 noon. 30 tablet 11      Review of Systems:  All systems reviewed and negative except as stated in HPI  PE: Height '5\' 3"'  (1.6 m), weight 88.5 kg, last menstrual period 08/12/2019. General appearance: alert, cooperative, appears stated age and no distress Lungs: regular rate and effort Heart: regular rate  Abdomen: soft, non-tender Extremities: Homans sign is negative, no sign of DVT Presentation: cephalic EFM: 352 bpm, moderate variability, present accels, no decels Toco: q5-65mn    Prenatal labs: ABO, Rh: O/Positive/-- (12/16 1213) Antibody: Negative (12/16 1213) Rubella: 13.50 (12/16 1213) RPR: Non Reactive (05/13 0853)  HBsAg: Negative (12/16 1213)  HIV: Non Reactive (05/13 0853)  GBS: Negative/-- (06/10 1646)  2 hr GTT: [phone] Prenatal Transfer Tool  Maternal Diabetes: Yes:  Diabetes Type:  Insulin/Medication controlled Genetic Screening: Normal Maternal Ultrasounds/Referrals: None Fetal Ultrasounds or other Referrals:  None Maternal Substance Abuse:  No Significant Maternal Medications: None Significant Maternal Lab Results: Group B Strep negative  No results found for this or any previous visit (from the past 24 hour(s)).  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 G1P0 at 429w0dere [facility]   1. Labor: IOL, cervix not favorable, start with misoprostol, FB once feasible 2. FWB: Cat 1 3. Pain: per patient request, plans epidural 4. GBS: negative 5.  GDMA2:  CBG q4h latent labor, q2h active labor  Susan Lilland, DO  PGY1, Family Medicine 05/18/2020, 8:40 AM    OB FELLOW ATTESTATION  I have seen and examined this patient and edited the above documentation in the resident's note to reflect any changes or updates.  Susan Moore/[facility] OB Fellow  05/18/2020, 10:51 AM

## 2020-05-18 NOTE — Anesthesia Preprocedure Evaluation (Signed)
Anesthesia Evaluation  Patient identified by MRN, date of birth, ID band Patient awake    Reviewed: Allergy & Precautions, NPO status , Patient's Chart, lab work & pertinent test results  Airway Mallampati: II  TM Distance: >3 FB Neck ROM: Full    Dental no notable dental hx. (+) Dental Advisory Given   Pulmonary asthma ,    Pulmonary exam normal        Cardiovascular negative cardio ROS Normal cardiovascular exam     Neuro/Psych negative neurological ROS  negative psych ROS   GI/Hepatic negative GI ROS, Neg liver ROS,   Endo/Other  diabetes  Renal/GU negative Renal ROS  negative genitourinary   Musculoskeletal negative musculoskeletal ROS (+)   Abdominal   Peds negative pediatric ROS (+)  Hematology negative hematology ROS (+)   Anesthesia Other Findings   Reproductive/Obstetrics (+) Pregnancy                             Anesthesia Physical Anesthesia Plan  ASA: II  Anesthesia Plan: Epidural   Post-op Pain Management:    Induction:   PONV Risk Score and Plan:   Airway Management Planned:   Additional Equipment:   Intra-op Plan:   Post-operative Plan:   Informed Consent: I have reviewed the patients History and Physical, chart, labs and discussed the procedure including the risks, benefits and alternatives for the proposed anesthesia with the patient or authorized representative who has indicated his/her understanding and acceptance.       Plan Discussed with: Anesthesiologist  Anesthesia Plan Comments:         Anesthesia Quick Evaluation

## 2020-05-19 ENCOUNTER — Encounter (HOSPITAL_COMMUNITY)
Admission: AD | Disposition: A | Discharge status: Home / Self Care | Payer: Self-pay | Source: Home / Self Care | Attending: Family Medicine

## 2020-05-19 DIAGNOSIS — O411230 Chorioamnionitis, third trimester, not applicable or unspecified: Secondary | ICD-10-CM | POA: Diagnosis not present

## 2020-05-19 DIAGNOSIS — O24425 Gestational diabetes mellitus in childbirth, controlled by oral hypoglycemic drugs: Secondary | ICD-10-CM | POA: Diagnosis not present

## 2020-05-19 DIAGNOSIS — Z3A40 40 weeks gestation of pregnancy: Secondary | ICD-10-CM | POA: Diagnosis not present

## 2020-05-19 LAB — GLUCOSE, CAPILLARY
Glucose-Capillary: 78 mg/dL (ref 70–99)
Glucose-Capillary: 114 mg/dL — ABNORMAL HIGH (ref 70–99)
Glucose-Capillary: 96 mg/dL (ref 70–99)
Glucose-Capillary: 84 mg/dL (ref 70–99)
Glucose-Capillary: 117 mg/dL — ABNORMAL HIGH (ref 70–99)
Glucose-Capillary: 85 mg/dL (ref 70–99)

## 2020-05-19 LAB — PROTEIN / CREATININE RATIO, URINE
Creatinine, Urine: 72.39 mg/dL
Protein Creatinine Ratio: 0.25 mg/mg{Cre} — ABNORMAL HIGH (ref 0.00–0.15)
Total Protein, Urine: 18 mg/dL

## 2020-05-19 SURGERY — Surgical Case
Anesthesia: Epidural | Wound class: Clean Contaminated

## 2020-05-19 MED ORDER — LIDOCAINE-EPINEPHRINE (PF) 2 %-1:200000 IJ SOLN
INTRAMUSCULAR | Status: DC | PRN
  Administered 2020-05-19: 10 mL via EPIDURAL

## 2020-05-19 MED ORDER — MORPHINE SULFATE (PF) 0.5 MG/ML IJ SOLN
INTRAMUSCULAR | Status: AC
  Filled 2020-05-19: qty 10

## 2020-05-19 MED ORDER — ACETAMINOPHEN 650 MG RE SUPP
975.0000 mg | Freq: Once | RECTAL | Status: AC
  Administered 2020-05-19: 975 mg via RECTAL
  Filled 2020-05-19: qty 2

## 2020-05-19 MED ORDER — OXYTOCIN-SODIUM CHLORIDE 30-0.9 UT/500ML-% IV SOLN
INTRAVENOUS | Status: DC | PRN
  Administered 2020-05-19: 300 mL via INTRAVENOUS

## 2020-05-19 MED ORDER — BUPIVACAINE HCL (PF) 0.25 % IJ SOLN
INTRAMUSCULAR | Status: AC
  Filled 2020-05-19: qty 30

## 2020-05-19 MED ORDER — FENTANYL CITRATE (PF) 100 MCG/2ML IJ SOLN
INTRAMUSCULAR | Status: DC | PRN
  Administered 2020-05-19: 100 ug via EPIDURAL

## 2020-05-19 MED ORDER — SODIUM CHLORIDE 0.9 % IV SOLN
2.0000 g | Freq: Four times a day (QID) | INTRAVENOUS | Status: DC
  Administered 2020-05-19 (×2): 2 g via INTRAVENOUS
  Filled 2020-05-19 (×2): qty 2000

## 2020-05-19 MED ORDER — DEXAMETHASONE SODIUM PHOSPHATE 4 MG/ML IJ SOLN
INTRAMUSCULAR | Status: AC
  Filled 2020-05-19: qty 1

## 2020-05-19 MED ORDER — GENTAMICIN SULFATE 40 MG/ML IJ SOLN
5.0000 mg/kg | INTRAVENOUS | Status: DC
  Administered 2020-05-19: 330 mg via INTRAVENOUS
  Filled 2020-05-19: qty 8.25

## 2020-05-19 MED ORDER — LIDOCAINE 2% (20 MG/ML) 5 ML SYRINGE
INTRAMUSCULAR | Status: AC
  Filled 2020-05-19: qty 5

## 2020-05-19 MED ORDER — ONDANSETRON HCL 4 MG/2ML IJ SOLN
INTRAMUSCULAR | Status: DC | PRN
  Administered 2020-05-19: 4 mg via INTRAVENOUS

## 2020-05-19 MED ORDER — FENTANYL CITRATE (PF) 100 MCG/2ML IJ SOLN
INTRAMUSCULAR | Status: AC
  Filled 2020-05-19: qty 2

## 2020-05-19 MED ORDER — SODIUM CHLORIDE 0.9 % IV SOLN
INTRAVENOUS | Status: AC
  Filled 2020-05-19: qty 500

## 2020-05-19 MED ORDER — SODIUM CHLORIDE 0.9 % IV SOLN
500.0000 mg | Freq: Once | INTRAVENOUS | Status: AC
  Administered 2020-05-19: 500 mg via INTRAVENOUS

## 2020-05-19 MED ORDER — MORPHINE SULFATE (PF) 0.5 MG/ML IJ SOLN
INTRAMUSCULAR | Status: DC | PRN
  Administered 2020-05-19: 3 mg via EPIDURAL

## 2020-05-19 MED ORDER — DEXAMETHASONE SODIUM PHOSPHATE 4 MG/ML IJ SOLN
INTRAMUSCULAR | Status: DC | PRN
  Administered 2020-05-19: 4 mg via INTRAVENOUS

## 2020-05-19 MED ORDER — OXYTOCIN-SODIUM CHLORIDE 30-0.9 UT/500ML-% IV SOLN
INTRAVENOUS | Status: AC
  Filled 2020-05-19: qty 1000

## 2020-05-19 MED ORDER — ONDANSETRON HCL 4 MG/2ML IJ SOLN
INTRAMUSCULAR | Status: AC
  Filled 2020-05-19: qty 2

## 2020-05-19 SURGICAL SUPPLY — 36 items
BENZOIN TINCTURE PRP APPL 2/3 (GAUZE/BANDAGES/DRESSINGS) ×3 IMPLANT
CLAMP CORD UMBIL (MISCELLANEOUS) ×3 IMPLANT
CLOSURE STERI STRIP 1/2 X4 (GAUZE/BANDAGES/DRESSINGS) ×3 IMPLANT
CLOTH BEACON ORANGE TIMEOUT ST (SAFETY) ×3 IMPLANT
DECANTER SPIKE VIAL GLASS SM (MISCELLANEOUS) ×3 IMPLANT
DRSG OPSITE POSTOP 4X10 (GAUZE/BANDAGES/DRESSINGS) ×3 IMPLANT
ELECT REM PT RETURN 9FT ADLT (ELECTROSURGICAL) ×3
ELECTRODE REM PT RTRN 9FT ADLT (ELECTROSURGICAL) ×1 IMPLANT
EXTRACTOR VACUUM M CUP 4 TUBE (SUCTIONS) IMPLANT
EXTRACTOR VACUUM M CUP 4' TUBE (SUCTIONS)
GLOVE BIOGEL PI IND STRL 7.0 (GLOVE) ×3 IMPLANT
GLOVE BIOGEL PI INDICATOR 7.0 (GLOVE) ×6
GLOVE ECLIPSE 7.0 STRL STRAW (GLOVE) ×3 IMPLANT
GOWN STRL REUS W/TWL LRG LVL3 (GOWN DISPOSABLE) ×6 IMPLANT
KIT ABG SYR 3ML LUER SLIP (SYRINGE) ×3 IMPLANT
NEEDLE HYPO 22GX1.5 SAFETY (NEEDLE) ×3 IMPLANT
NEEDLE HYPO 25X5/8 SAFETYGLIDE (NEEDLE) ×3 IMPLANT
NS IRRIG 1000ML POUR BTL (IV SOLUTION) ×3 IMPLANT
PACK C SECTION WH (CUSTOM PROCEDURE TRAY) ×3 IMPLANT
PAD ABD 7.5X8 STRL (GAUZE/BANDAGES/DRESSINGS) ×3 IMPLANT
PAD ABD 8X10 STRL (GAUZE/BANDAGES/DRESSINGS) ×3 IMPLANT
PAD OB MATERNITY 4.3X12.25 (PERSONAL CARE ITEMS) ×3 IMPLANT
PENCIL SMOKE EVAC W/HOLSTER (ELECTROSURGICAL) ×3 IMPLANT
RTRCTR C-SECT PINK 25CM LRG (MISCELLANEOUS) ×3 IMPLANT
SPONGE GAUZE 4X4 12PLY STER LF (GAUZE/BANDAGES/DRESSINGS) ×6 IMPLANT
STRIP CLOSURE SKIN 1/2X4 (GAUZE/BANDAGES/DRESSINGS) ×2 IMPLANT
SUT MNCRL 0 VIOLET CTX 36 (SUTURE) ×2 IMPLANT
SUT MONOCRYL 0 CTX 36 (SUTURE) ×6
SUT VIC AB 0 CTX 36 (SUTURE) ×3
SUT VIC AB 0 CTX36XBRD ANBCTRL (SUTURE) ×1 IMPLANT
SUT VIC AB 4-0 KS 27 (SUTURE) ×3 IMPLANT
SYR 30ML LL (SYRINGE) ×3 IMPLANT
TAPE MEDIFIX FOAM 3 (GAUZE/BANDAGES/DRESSINGS) ×3 IMPLANT
TOWEL OR 17X24 6PK STRL BLUE (TOWEL DISPOSABLE) ×3 IMPLANT
TRAY FOLEY W/BAG SLVR 14FR LF (SET/KITS/TRAYS/PACK) ×3 IMPLANT
WATER STERILE IRR 1000ML POUR (IV SOLUTION) ×3 IMPLANT

## 2020-05-19 NOTE — Progress Notes (Signed)
Vitals:   05/19/20 0020 05/19/20 0021  BP:  123/72  Pulse:  77  Resp:  18  Temp:    SpO2: 100%    Comfortable w/epidural.  FHR Cat 1. Ctx q 203 minutes. Pitocin at 6mu/min. Cx 4.5/80/-2.  Continue to increase pitocin until labor adequate

## 2020-05-19 NOTE — Progress Notes (Signed)
Patient Vitals for the past 4 hrs:  BP Temp Temp src Pulse Resp  05/19/20 0630 122/72 -- -- 93 18  05/19/20 0618 -- 98.6 F (37 C) Oral -- --  05/19/20 0600 124/75 -- -- 84 18  05/19/20 0530 129/73 -- -- 91 18  05/19/20 0500 114/67 -- -- 96 --  05/19/20 0431 115/68 -- -- 88 --  05/19/20 0401 130/74 -- -- 94 18  05/19/20 0331 120/69 -- -- 86 18  05/19/20 0301 130/75 -- -- 85 18   Blood sugar 114.  Comfortable w/epidural.  Cx no change:  4/80/-2. Pitocin at 12 mu/min.  Ctx q 2 minutes. FHR Cat 1 Consider AROM/pit break/keep on increasing pitocin as possible management strategies.  Will increase pitocin for now.

## 2020-05-19 NOTE — Progress Notes (Signed)
Susan Moore is a 19 y.o. G1P0 at [redacted]w[redacted]d admitted for IOL 2/2 A2GDM  Subjective: Feeling rectal pressure, feeling warm  Objective: BP 133/64   Pulse (!) 117   Temp (!) 102.8 F (39.3 C) (Oral)   Resp 17   Ht 5\' 3"  (1.6 m)   Wt 88.5 kg   LMP 08/12/2019   SpO2 100%   BMI 34.56 kg/m  No intake/output data recorded.  FHT:  FHR: 180 bpm, variability: minimal-moderate,  accelerations:  Present- scalp stim and 10x10,  decelerations:  Absent UC:   regular, every 4-5 minutes  SVE:   Dilation: 6 Effacement (%): 100 Station: 0 Exam by:: Dr. Darene Lamer  Pitocin @ 10 mu/min  Labs: Lab Results  Component Value Date   WBC 7.5 05/18/2020   HGB 11.9 (L) 05/18/2020   HCT 37.9 05/18/2020   MCV 89.8 05/18/2020   PLT 214 05/18/2020    Assessment / Plan: 19 yo G1P0 at 86.1 EGA here for IOL 2/2 A2GDM on metformin  Labor: s/p FB and cyto x2, Pitocin started at 1654 last night, stopped from 1520-1725, now at 10 units. IUPC in place, MVUs ~40. Will continue to titrate pitocin Fetal Wellbeing:  Category II, reassuring for + scalp stim, periods of moderate variability Pain Control:  Epidural Triple I: on Amp/Gent I/D:  GBS negative A2GDM:  Most recent CBGs 114, 96, 84, 117  Anticipated MOD:  guarded for vaginal delivery, CS as appropriate    Merilyn Baba DO OB Fellow, Faculty Practice 05/19/2020, 9:01 PM

## 2020-05-19 NOTE — Progress Notes (Addendum)
Susan Moore is a 19 y.o. G1P0 at [redacted]w[redacted]d by LMP admitted for induction of labor due to Gestational diabetes.  Subjective: Pt comfortable w/ epidural.   Objective: BP 121/83   Pulse 97   Temp 98.6 F (37 C) (Oral)   Resp 16   Ht 5\' 3"  (1.6 m)   Wt 88.5 kg   LMP 08/12/2019   SpO2 100%   BMI 34.56 kg/m  I/O last 3 completed shifts: In: -  Out: 150 [Urine:150] No intake/output data recorded.  FHT:  FHR: 140 bpm, variability: moderate,  accelerations:  Present,  decelerations:  Absent UC:   regular, every 2 minutes SVE:   Dilation: 4.5 Effacement (%): 80 Station: -2 Exam by:: Aldona Lento, SNM AROM w/ clear fluid & IUPC placed, pt tolerated well  Labs: Lab Results  Component Value Date   WBC 7.5 05/18/2020   HGB 11.9 (L) 05/18/2020   HCT 37.9 05/18/2020   MCV 89.8 05/18/2020   PLT 214 05/18/2020    Assessment / Plan: Induction of labor due to gestational diabetes; protracted latent phase, SVE remains same 12+ hrs  Labor: Progressing on Pitocin, will continue to increase Preeclampsia:   NA Fetal Wellbeing:  Category I Pain Control:  Epidural I/D:   GBS- Anticipated MOD:  NSVD  Quillian Quince 05/19/2020, 9:45 AM  I confirm that I have verified the information documented in the student midwife's note and that I have also personally performed the physical exam and all medical decision making activities.   Fatima Blank, CNM 8:48 PM

## 2020-05-19 NOTE — Progress Notes (Signed)
Pharmacy Antibiotic Note  Susan Moore is a 19 y.o. female admitted on 05/18/2020 for IOL due to [place] at [redacted]w[redacted]d.  Pharmacy has been consulted for Gentamicin dosing for Triple I/ Chorioamnionitis.  Plan: Gentamicin 330mg  IV q24h (5mg /kg)  Will continue to follow and assess need for further workup.  Height: 5\' 3"  (160 cm) Weight: 88.5 kg (195 lb 1.6 oz) IBW/kg (Calculated) : 52.4  Adjusted/Dosing weight: 66.8kg  Temp (24hrs), Avg:98.5 F (36.9 C), Min:98 F (36.7 C), Max:99.1 F (37.3 C)  Recent Labs  Lab 05/18/20 0818  WBC 7.5  CREATININE 0.84    Estimated Creatinine Clearance: 111.7 mL/min (by C-G formula based on SCr of 0.84 mg/dL).    No Known Allergies  Antimicrobials this admission: Ampicillin 2 gram IV q6h 7/9 >>   Thank you for allowing pharmacy to be a part of this patient's care.  Vernie Ammons 05/19/2020 5:21 PM

## 2020-05-19 NOTE — Progress Notes (Signed)
Susan Moore is a 19 y.o. G1P0 at [redacted]w[redacted]d admitted for induction of labor due to Gestational diabetes.  Subjective: Pt feeling pelvic and rectal pressure with epidural  Objective: BP (!) 148/86   Pulse 97   Temp (!) 100.6 F (38.1 C) (Oral)   Resp 16   Ht 5\' 3"  (1.6 m)   Wt 88.5 kg   LMP 08/12/2019   SpO2 100%   BMI 34.56 kg/m  I/O last 3 completed shifts: In: -  Out: 150 [Urine:150] Total I/O In: -  Out: 700 [Urine:700]  FHT:  FHR: 180 bpm, variability: moderate,  accelerations:  Present,  decelerations:  Absent UC:   Irregular, mild to palpation SVE:   Dilation: 6 Effacement (%): 100 Station: -1 Exam by:: leftwhich kirby,cnm  Labs: Lab Results  Component Value Date   WBC 7.5 05/18/2020   HGB 11.9 (L) 05/18/2020   HCT 37.9 05/18/2020   MCV 89.8 05/18/2020   PLT 214 05/18/2020    Assessment / Plan: Induction of labor due to gestational diabetes Pitocin off x 2 hours Triple I/chorioamnionitis  Labor: Restart Pitocin at 2 milliunits/min, increase per protocol. RN to dose epidural for pt comfort prior to increasing pitocin. Start ampicillin/gentamycin for Triple I. Tylenol PO given. IV fluid bolus given. Preeclampsia:  n/a Fetal Wellbeing:  Category I Pain Control:  Epidural I/D:  GBS neg Anticipated MOD:  NSVD  Fatima Blank 05/19/2020, 5:35 PM

## 2020-05-19 NOTE — Progress Notes (Addendum)
Loney Peto is a 19 y.o. G1P0 at [redacted]w[redacted]d  admitted for IOL for [place].   Subjective: Pt tolerating labor well w/ epidural   Objective: BP 130/76   Pulse 88   Temp 98.6 F (37 C) (Oral)   Resp 16   Ht 5\' 3"  (1.6 m)   Wt 88.5 kg   LMP 08/12/2019   SpO2 100%   BMI 34.56 kg/m  I/O last 3 completed shifts: In: -  Out: 150 [Urine:150] No intake/output data recorded.  FHT:  FHR: 145 bpm, variability: moderate,  accelerations:  Present,  decelerations:  Absent UC:   regular, every 2-4 minutes SVE:   Dilation: 6 Effacement (%): 100 Station: -1 Exam by:: QVL  Labs: Lab Results  Component Value Date   WBC 7.5 05/18/2020   HGB 11.9 (L) 05/18/2020   HCT 37.9 05/18/2020   MCV 89.8 05/18/2020   PLT 214 05/18/2020    Assessment / Plan: Discussed options w/ pt; opted for 2-hour pit "vacation", then place new IUPC and restart pit. Pt satisfied w/ plan.  Labor:  protracted active phase Preeclampsia:   NA Fetal Wellbeing:  Category I Pain Control:  Epidural I/D:   GBS- Anticipated MOD:  NSVD  Quillian Quince 05/19/2020, 3:21 PM  I confirm that I have verified the information documented in the student midwife's note and that I have also personally performed the physical exam and all medical decision making activities.   Fatima Blank, CNM 8:48 PM

## 2020-05-19 NOTE — Progress Notes (Signed)
Fetal tachycardia in the 180s, Febrile at 102*F for 2 hours despite 1000 mg tylenol. No change in cervical dilation since 1500 this afternoon, MVUs still not adequate despite pit break and pit@20 .   I recommended primary CS 2/2 arrest of dilation and fetal tachycardia.  The risks of cesarean section were discussed with the patient including but were not limited to: bleeding which may require transfusion or reoperation; infection which may require antibiotics; injury to bowel, bladder, ureters or other surrounding organs; injury to the fetus; need for additional procedures including hysterectomy in the event of a life-threatening hemorrhage; placental abnormalities wth subsequent pregnancies, incisional problems, thromboembolic phenomenon and other postoperative/anesthesia complications.  The patient concurred with the proposed plan, giving informed written consent for the procedures.  Patient has been NPO since 1930 she will remain NPO for procedure. Anesthesia and OR aware.  Preoperative prophylactic antibiotics and SCDs ordered on call to the OR.  To OR when ready.  Merilyn Baba, DO OB Fellow, Faculty Practice 05/19/2020 11:15 PM

## 2020-05-20 ENCOUNTER — Encounter (HOSPITAL_COMMUNITY): Payer: Self-pay | Admitting: Family Medicine

## 2020-05-20 DIAGNOSIS — O411290 Chorioamnionitis, unspecified trimester, not applicable or unspecified: Secondary | ICD-10-CM | POA: Diagnosis not present

## 2020-05-20 LAB — CBC WITH DIFFERENTIAL/PLATELET
Abs Immature Granulocytes: 0.5 10*3/uL — ABNORMAL HIGH (ref 0.00–0.07)
Basophils Absolute: 0.1 10*3/uL (ref 0.0–0.1)
Basophils Relative: 0 %
Eosinophils Absolute: 0 10*3/uL (ref 0.0–0.5)
Eosinophils Relative: 0 %
HCT: 29.5 % — ABNORMAL LOW (ref 36.0–46.0)
Hemoglobin: 9.6 g/dL — ABNORMAL LOW (ref 12.0–15.0)
Immature Granulocytes: 2 %
Lymphocytes Relative: 8 %
Lymphs Abs: 1.8 10*3/uL (ref 0.7–4.0)
MCH: 28.7 pg (ref 26.0–34.0)
MCHC: 32.5 g/dL (ref 30.0–36.0)
MCV: 88.1 fL (ref 80.0–100.0)
Monocytes Absolute: 1.8 10*3/uL — ABNORMAL HIGH (ref 0.1–1.0)
Monocytes Relative: 8 %
Neutro Abs: 18 10*3/uL — ABNORMAL HIGH (ref 1.7–7.7)
Neutrophils Relative %: 82 %
Platelets: 166 10*3/uL (ref 150–400)
RBC: 3.35 MIL/uL — ABNORMAL LOW (ref 3.87–5.11)
RDW: 14.9 % (ref 11.5–15.5)
WBC: 22.1 10*3/uL — ABNORMAL HIGH (ref 4.0–10.5)
nRBC: 0 % (ref 0.0–0.2)

## 2020-05-20 LAB — CREATININE, SERUM
Creatinine, Ser: 1.02 mg/dL — ABNORMAL HIGH (ref 0.44–1.00)
GFR calc Af Amer: >60 mL/min (ref >60)
GFR calc non Af Amer: >60 mL/min (ref >60)

## 2020-05-20 LAB — CBC
HCT: 35.2 % — ABNORMAL LOW (ref 36.0–46.0)
Hemoglobin: 11.5 g/dL — ABNORMAL LOW (ref 12.0–15.0)
MCH: 28.8 pg (ref 26.0–34.0)
MCHC: 32.7 g/dL (ref 30.0–36.0)
MCV: 88.2 fL (ref 80.0–100.0)
Platelets: 206 10*3/uL (ref 150–400)
RBC: 3.99 MIL/uL (ref 3.87–5.11)
RDW: 14.6 % (ref 11.5–15.5)
WBC: 20.5 10*3/uL — ABNORMAL HIGH (ref 4.0–10.5)
nRBC: 0 % (ref 0.0–0.2)

## 2020-05-20 LAB — GLUCOSE, CAPILLARY: Glucose-Capillary: 83 mg/dL (ref 70–99)

## 2020-05-20 MED ORDER — COCONUT OIL OIL: 1.0000 "application " | TOPICAL_OIL | Status: DC | PRN

## 2020-05-20 MED ORDER — SODIUM CHLORIDE 0.9 % IR SOLN
Status: DC | PRN
  Administered 2020-05-20: 1000 mL

## 2020-05-20 MED ORDER — BUPIVACAINE HCL 0.25 % IJ SOLN
INTRAMUSCULAR | Status: DC | PRN
  Administered 2020-05-20: 30 mL

## 2020-05-20 MED ORDER — NALOXONE HCL 0.4 MG/ML IJ SOLN: 0.4000 mg | INTRAMUSCULAR | Status: DC | PRN

## 2020-05-20 MED ORDER — PROMETHAZINE HCL 25 MG/ML IJ SOLN: 6.2500 mg | INTRAMUSCULAR | Status: DC | PRN

## 2020-05-20 MED ORDER — GENTAMICIN SULFATE 40 MG/ML IJ SOLN
5.0000 mg/kg | INTRAVENOUS | Status: AC
  Administered 2020-05-20: 330 mg via INTRAVENOUS
  Filled 2020-05-20: qty 8.25

## 2020-05-20 MED ORDER — ACETAMINOPHEN 500 MG PO TABS
1000.0000 mg | ORAL_TABLET | Freq: Four times a day (QID) | ORAL | Status: DC
  Administered 2020-05-20 – 2020-05-22 (×9): 1000 mg via ORAL
  Filled 2020-05-20 (×11): qty 2

## 2020-05-20 MED ORDER — NALBUPHINE HCL 10 MG/ML IJ SOLN: 5.0000 mg | Freq: Once | INTRAMUSCULAR | Status: DC | PRN

## 2020-05-20 MED ORDER — KETOROLAC TROMETHAMINE 30 MG/ML IJ SOLN
30.0000 mg | Freq: Once | INTRAMUSCULAR | Status: AC
  Administered 2020-05-20: 30 mg via INTRAVENOUS

## 2020-05-20 MED ORDER — OXYCODONE HCL 5 MG PO TABS
5.0000 mg | ORAL_TABLET | ORAL | Status: DC | PRN
  Administered 2020-05-21: 5 mg via ORAL
  Filled 2020-05-20: qty 1

## 2020-05-20 MED ORDER — HYDROMORPHONE HCL 1 MG/ML IJ SOLN
INTRAMUSCULAR | Status: AC
  Filled 2020-05-20: qty 0.5

## 2020-05-20 MED ORDER — ENOXAPARIN SODIUM 60 MG/0.6ML ~~LOC~~ SOLN
0.5000 mg/kg | SUBCUTANEOUS | Status: DC
  Administered 2020-05-20 – 2020-05-21 (×2): 45 mg via SUBCUTANEOUS
  Filled 2020-05-20 (×2): qty 0.6

## 2020-05-20 MED ORDER — SENNOSIDES-DOCUSATE SODIUM 8.6-50 MG PO TABS
2.0000 | ORAL_TABLET | ORAL | Status: DC
  Administered 2020-05-21 – 2020-05-22 (×2): 2 via ORAL
  Filled 2020-05-20 (×2): qty 2

## 2020-05-20 MED ORDER — SODIUM CHLORIDE 0.9% FLUSH: 3.0000 mL | INTRAVENOUS | Status: DC | PRN

## 2020-05-20 MED ORDER — OXYTOCIN-SODIUM CHLORIDE 30-0.9 UT/500ML-% IV SOLN: 2.5000 [IU]/h | INTRAVENOUS | Status: AC

## 2020-05-20 MED ORDER — LACTATED RINGERS IV BOLUS
1000.0000 mL | Freq: Once | INTRAVENOUS | Status: AC
  Administered 2020-05-20: 1000 mL via INTRAVENOUS

## 2020-05-20 MED ORDER — DIBUCAINE (PERIANAL) 1 % EX OINT: 1.0000 "application " | TOPICAL_OINTMENT | CUTANEOUS | Status: DC | PRN

## 2020-05-20 MED ORDER — SIMETHICONE 80 MG PO CHEW
80.0000 mg | CHEWABLE_TABLET | ORAL | Status: DC
  Administered 2020-05-21 – 2020-05-22 (×2): 80 mg via ORAL
  Filled 2020-05-20 (×2): qty 1

## 2020-05-20 MED ORDER — KETOROLAC TROMETHAMINE 30 MG/ML IJ SOLN
INTRAMUSCULAR | Status: AC
  Filled 2020-05-20: qty 1

## 2020-05-20 MED ORDER — DIPHENHYDRAMINE HCL 50 MG/ML IJ SOLN: 12.5000 mg | INTRAMUSCULAR | Status: DC | PRN

## 2020-05-20 MED ORDER — HYDROMORPHONE HCL 1 MG/ML IJ SOLN
0.2000 mg | INTRAMUSCULAR | Status: DC | PRN
  Administered 2020-05-20: 0.2 mg via INTRAVENOUS
  Filled 2020-05-20: qty 1

## 2020-05-20 MED ORDER — SCOPOLAMINE 1 MG/3DAYS TD PT72
1.0000 | MEDICATED_PATCH | Freq: Once | TRANSDERMAL | Status: DC
  Administered 2020-05-20: 1.5 mg via TRANSDERMAL
  Filled 2020-05-20: qty 1

## 2020-05-20 MED ORDER — SIMETHICONE 80 MG PO CHEW
80.0000 mg | CHEWABLE_TABLET | Freq: Three times a day (TID) | ORAL | Status: DC
  Administered 2020-05-20 – 2020-05-22 (×7): 80 mg via ORAL
  Filled 2020-05-20 (×7): qty 1

## 2020-05-20 MED ORDER — NALBUPHINE HCL 10 MG/ML IJ SOLN: 5.0000 mg | INTRAMUSCULAR | Status: DC | PRN

## 2020-05-20 MED ORDER — DIPHENHYDRAMINE HCL 25 MG PO CAPS: 25.0000 mg | ORAL_CAPSULE | ORAL | Status: DC | PRN

## 2020-05-20 MED ORDER — WITCH HAZEL-GLYCERIN EX PADS: 1.0000 "application " | MEDICATED_PAD | CUTANEOUS | Status: DC | PRN

## 2020-05-20 MED ORDER — MEPERIDINE HCL 25 MG/ML IJ SOLN: 6.2500 mg | INTRAMUSCULAR | Status: DC | PRN

## 2020-05-20 MED ORDER — SIMETHICONE 80 MG PO CHEW: 80.0000 mg | CHEWABLE_TABLET | ORAL | Status: DC | PRN

## 2020-05-20 MED ORDER — TETANUS-DIPHTH-ACELL PERTUSSIS 5-2.5-18.5 LF-MCG/0.5 IM SUSP: 0.5000 mL | Freq: Once | INTRAMUSCULAR | Status: DC

## 2020-05-20 MED ORDER — ZOLPIDEM TARTRATE 5 MG PO TABS: 5.0000 mg | ORAL_TABLET | Freq: Every evening | ORAL | Status: DC | PRN

## 2020-05-20 MED ORDER — NALOXONE HCL 4 MG/10ML IJ SOLN: 1.0000 ug/kg/h | INTRAVENOUS | Status: DC | PRN

## 2020-05-20 MED ORDER — IBUPROFEN 800 MG PO TABS
800.0000 mg | ORAL_TABLET | Freq: Four times a day (QID) | ORAL | Status: DC
  Administered 2020-05-21 – 2020-05-22 (×7): 800 mg via ORAL
  Filled 2020-05-20 (×7): qty 1

## 2020-05-20 MED ORDER — SODIUM CHLORIDE 0.9 % IV SOLN
2.0000 g | Freq: Four times a day (QID) | INTRAVENOUS | Status: AC
  Administered 2020-05-20 (×4): 2 g via INTRAVENOUS
  Filled 2020-05-20 (×4): qty 2000

## 2020-05-20 MED ORDER — MEDROXYPROGESTERONE ACETATE 150 MG/ML IM SUSP: 150.0000 mg | Freq: Once | INTRAMUSCULAR | Status: DC

## 2020-05-20 MED ORDER — ONDANSETRON HCL 4 MG/2ML IJ SOLN: 4.0000 mg | Freq: Three times a day (TID) | INTRAMUSCULAR | Status: DC | PRN

## 2020-05-20 MED ORDER — MENTHOL 3 MG MT LOZG: 1.0000 | LOZENGE | OROMUCOSAL | Status: DC | PRN

## 2020-05-20 MED ORDER — DIPHENHYDRAMINE HCL 25 MG PO CAPS: 25.0000 mg | ORAL_CAPSULE | Freq: Four times a day (QID) | ORAL | Status: DC | PRN

## 2020-05-20 MED ORDER — METHYLERGONOVINE MALEATE 0.2 MG/ML IJ SOLN
INTRAMUSCULAR | Status: AC
  Filled 2020-05-20: qty 1

## 2020-05-20 MED ORDER — HYDROMORPHONE HCL 1 MG/ML IJ SOLN
0.2500 mg | INTRAMUSCULAR | Status: DC | PRN
  Administered 2020-05-20: 0.5 mg via INTRAVENOUS

## 2020-05-20 MED ORDER — METHYLERGONOVINE MALEATE 0.2 MG/ML IJ SOLN
INTRAMUSCULAR | Status: DC | PRN
Start: 2020-05-20 — End: 2020-05-20
  Administered 2020-05-20: .2 mg via INTRAMUSCULAR

## 2020-05-20 MED ORDER — KETOROLAC TROMETHAMINE 30 MG/ML IJ SOLN
30.0000 mg | Freq: Four times a day (QID) | INTRAMUSCULAR | Status: AC
  Administered 2020-05-20 (×3): 30 mg via INTRAVENOUS
  Filled 2020-05-20 (×3): qty 1

## 2020-05-20 MED ORDER — PRENATAL MULTIVITAMIN CH
1.0000 | ORAL_TABLET | Freq: Every day | ORAL | Status: DC
  Administered 2020-05-20 – 2020-05-22 (×3): 1 via ORAL
  Filled 2020-05-20 (×3): qty 1

## 2020-05-20 MED ORDER — LACTATED RINGERS IV SOLN
INTRAVENOUS | Status: DC
  Administered 2020-05-20: 125 mL/h via INTRAVENOUS

## 2020-05-20 NOTE — Transfer of Care (Signed)
Immediate Anesthesia Transfer of Care Note  Patient: Susan Moore  Procedure(s) Performed: CESAREAN SECTION  Patient Location: PACU  Anesthesia Type:Epidural  Level of Consciousness: awake, alert  and oriented  Airway & Oxygen Therapy: Patient Spontanous Breathing  Post-op Assessment: Report given to RN and Post -op Vital signs reviewed and stable  Post vital signs: Reviewed and stable  Last Vitals:  Vitals Value Taken Time  BP 131/75 05/20/20 0043  Temp    Pulse 109 05/20/20 0043  Resp 18 05/20/20 0043  SpO2 96 % 05/20/20 0042  Vitals shown include unvalidated device data.  Last Pain:  Vitals:   05/19/20 2204  TempSrc: Oral  PainSc:          Complications: No complications documented.

## 2020-05-20 NOTE — Lactation Note (Signed)
This note was copied from a baby's chart. Lactation Consultation Note  Patient Name: Susan Moore [id number] Date: 05/20/2020 Reason for consult: Initial assessment;Primapara;1st time breastfeeding;NICU baby;Term;Maternal endocrine disorder Type of Endocrine Disorder?: Diabetes (Diet controlled GDM)  LC in to visit with [name] of 70 hr old term infant in the NICU for respiratory support.  Mom desires to breastfeed, but didn't attend any prenatal breastfeeding education.  Reviewed basics of early breastfeeding/pumping for Mom.    Reviewed importance of breast massage and hand expression, demonstrating this to Mom and watching Mom return demo well.  Unable to express any colostrum at present.  Mom reports + breast changes in early pregnancy.  Set up DEBP and assisted Mom with first pumping on initiation setting.  Explained how to disassemble pump parts, wash, rinse and air dry in separate bin on counter.  Colostrum containers provided.  Mom to ask for breast milk labels from baby's nurse when she visits in the NICU.  Mom doesn't have a pump at home, knows there is a pump in baby's room and she does have [facility].  Referral sent to [facility] for pump on discharge from [facility].  Mom given lactation brochure and NICU booklet and Mom aware of IP and OP lactation support available to her.    Encouraged Mom to ask for assistance prn.   Interventions Interventions: Breast feeding basics reviewed;Skin to skin;Breast massage;Hand express;DEBP;Support pillows  Lactation Tools Discussed/Used Tools: Pump;Flanges Flange Size: 24 Breast pump type: Double-Electric Breast Pump WIC Program: Yes Pump Review: Setup, frequency, and cleaning;Milk Storage Initiated by:: Cipriano Mile RN IBCLC Date initiated:: 05/20/20   Consult Status Consult Status: Follow-up Date: 05/21/20 Follow-up type: In-patient    Susan Moore 05/20/2020, 12:10 PM

## 2020-05-20 NOTE — Progress Notes (Signed)
Patient still unable to void.Patient states feels a lot of pressure and is uncomfortable.I&O cath performed using sterile technique.800 cc clear amber urine obtained,patient tolerated well and states feels a lot better.

## 2020-05-20 NOTE — Anesthesia Postprocedure Evaluation (Signed)
Anesthesia Post Note  Patient: Susan Moore  Procedure(s) Performed: [place]     Patient location during evaluation: PACU Anesthesia Type: Epidural Level of consciousness: awake and alert Pain management: pain level controlled Vital Signs Assessment: post-procedure vital signs reviewed and stable Respiratory status: spontaneous breathing and respiratory function stable Cardiovascular status: blood pressure returned to baseline and stable Postop Assessment: spinal receding Anesthetic complications: no   No complications documented.  Last Vitals:  Vitals:   05/20/20 0205 05/20/20 0235  BP:    Pulse:    Resp:    Temp:    SpO2: 93% 99%    Last Pain:  Vitals:   05/20/20 0235  TempSrc:   PainSc: 4    Pain Goal: Patients Stated Pain Goal: 4 (05/20/20 0235)              Epidural/Spinal Function Cutaneous sensation: Normal sensation (05/20/20 0154), Patient able to flex knees: Yes (05/20/20 0154), Patient able to lift hips off bed: Yes (05/20/20 0154), Back pain beyond tenderness at insertion site: No (05/20/20 0154), Progressively worsening motor and/or sensory loss: No (05/20/20 0154), Bowel and/or bladder incontinence post epidural: No (05/20/20 0154)  Bueford Arp DANIEL

## 2020-05-20 NOTE — Discharge Summary (Signed)
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 °  Choroid plexus cyst of fetus affecting care of mother, antepartum °  Gestational diabetes mellitus (GDM) in third trimester °  Gestational diabetes mellitus (GDM) in third trimester controlled on oral hypoglycemic drug °  Chorioamnionitis, delivered, current hospitalization ° °Additional problems: NA    °Discharge diagnosis: Term Pregnancy Delivered and GDM A2                                °Post partum procedures:Antibiotic therpay x 24 hrs °Augmentation: AROM, Pitocin, Cytotec and IP Foley °Complications: Intrauterine Inflammation or infection (Chorioamniotis) ° °Hospital course: Induction of Labor With Cesarean Section   °19 y.o. yo G1P0 at [redacted]w[redacted]d was admitted to the hospital 05/18/2020 for induction of labor. Patient had a labor course significant for FB and cyto x2, Pitocin and AROM. She developed Triple I with fetal tachycardia. The patient went for cesarean section due to Arrest of Dilation and Non-Reassuring FHR. Delivery details are as follows: °Membrane Rupture Time/Date: 9:36 AM ,05/19/2020   °Delivery Method:C-Section, Low Transverse  °Details of operation can be found in separate operative Note.  Patient had an uncomplicated postpartum course. She is ambulating, tolerating a regular diet, passing flatus, and urinating well.  Patient is discharged home in stable condition on 05/22/20.     ° °Newborn Data: °Birth date:05/19/2020  °Birth time:11:52 PM   °Gender:Female  °Living status:Living  °Apgars:2 ,4  °Weight:3880 g                               ° ° °Magnesium Sulfate received: No °BMZ received: No °Rhophylac:No °MMR:No °T-DaP:Given prenatally °Flu: No °Transfusion:No ° °Physical exam  °Vitals:  ° 05/21/20 1701 05/21/20 2050 05/22/20 0451 05/22/20 0822  °BP: 110/69 110/64 127/73 128/69  °Pulse: 80 85 79 87  °Resp: 18 16 18 16  °Temp: 98 °F (36.7 °C) 98.1 °F (36.7 °C) 98.1 °F (36.7 °C) 98.4 °F (36.9 °C)  °TempSrc: Oral Oral Oral Oral  °SpO2: 100% 100% 100% 100%  °Weight:      °Height:      ° °General: alert °Lochia: appropriate °Uterine Fundus: firm °Incision: Healing well with no significant drainage °DVT Evaluation: No evidence of DVT seen on physical exam. °Labs: °Lab Results  °Component Value Date  ° WBC 22.1 (H) 05/20/2020  ° HGB 9.6 (L) 05/20/2020  ° HCT 29.5 (L) 05/20/2020  ° MCV 88.1 05/20/2020  ° PLT 166 05/20/2020  ° °CMP Latest Ref Rng & Units 05/21/2020  °Glucose 70 - 99 mg/dL 63(L)  °BUN 6 - 20 mg/dL 7  °Creatinine 0.44 - 1.00 mg/dL 0.78  °Sodium 135 - 145 mmol/L 138  °Potassium 3.5 - 5.1 mmol/L 4.3  °Chloride 98 - 111 mmol/L 108  °CO2 22 - 32 mmol/L 25  °Calcium 8.9 - 10.3 mg/dL 8.2(L)  °Total Protein 6.5 - 8.1 g/dL 4.7(L)  °Total Bilirubin 0.3 - 1.2   mg/dL 0.7  °Alkaline Phos 38 - 126 U/L 157(H)  °AST 15 - 41 U/L 24  °ALT 0 - 44 U/L 17  ° °Edinburgh Score: °Edinburgh Postnatal Depression Scale Screening Tool 05/20/2020  °I have been able to laugh and see the funny side of things. 0  °I have looked forward with enjoyment to things. 0  °I have blamed myself unnecessarily when things went wrong. 0  °I have been anxious or worried for no good reason. 0  °I have felt scared or panicky for no good reason. 0  °Things have been getting on top of me. 0  °I have been so unhappy that I have had difficulty sleeping. 0  °I have felt sad or miserable. 0  °I have been so unhappy that I have been crying. 0  °The thought of harming myself has occurred to me. 0   °Edinburgh Postnatal Depression Scale Total 0  ° ° ° °After visit meds:  °Allergies as of 05/22/2020   °No Known Allergies °  °  °Medication List  °  °STOP taking these medications   °Accu-Chek Guide test strip °Generic drug: glucose blood °  °Accu-Chek Softclix Lancets lancets °  °Blood Pressure Kit Devi °  °Comfort Fit Maternity Supp Sm Misc °  °famotidine 20 MG tablet °Commonly known as: Pepcid °  °metFORMIN 500 MG tablet °Commonly known as: Glucophage °  °  °TAKE these medications   °albuterol (2.5 MG/3ML) 0.083% nebulizer solution °Commonly known as: PROVENTIL °Take 3 mLs (2.5 mg total) by nebulization every 6 (six) hours as needed for wheezing. °  °albuterol 108 (90 Base) MCG/ACT inhaler °Commonly known as: VENTOLIN HFA °Inhale 1-2 puffs into the lungs every 6 (six) hours as needed for wheezing or shortness of breath. °  °ibuprofen 800 MG tablet °Commonly known as: ADVIL °Take 1 tablet (800 mg total) by mouth every 6 (six) hours. °  °oxyCODONE 5 MG immediate release tablet °Commonly known as: Oxy IR/ROXICODONE °Take 1 tablet (5 mg total) by mouth every 4 (four) hours as needed for moderate pain. °  °prenatal vitamin w/FE, FA 27-1 MG Tabs tablet °Take 1 tablet by mouth daily at 12 noon. °  °  ° ° ° °Discharge home in stable condition °Infant Feeding: Breast °Infant Disposition:home with mother °Discharge instruction: per After Visit Summary and Postpartum booklet. °Activity: Advance as tolerated. Pelvic rest for 6 weeks.  °Diet: routine diet °Future Appointments:No future appointments. °Follow up Visit: ° Follow-up Information   ° [facility]. Schedule an appointment as soon as possible for a visit in 4 week(s).   °Specialty: Obstetrics and Gynecology °Why: 4 weeks for postpartum appt °Contact information: °[address] °[place] [address] °[phone] ° °  °  °  °  °  ° ° ° °Please schedule this patient for a In person postpartum visit  in 4 weeks with the following provider: MD. °Additional Postpartum F/U:2 hour GTT  °High risk pregnancy complicated by: GDM °Delivery mode:  C-Section, Low Transverse  °Anticipated Birth Control:  Depo 05/20/20 ° ° °05/22/2020 °Michael L Ervin, MD ° ° °

## 2020-05-20 NOTE — Discharge Instructions (Signed)

## 2020-05-20 NOTE — Progress Notes (Signed)
Patient requested waiting for depo shot until postpartum visit.

## 2020-05-20 NOTE — Progress Notes (Signed)
Subjective: Postpartum Day 1: Cesarean Delivery Patient reports tolerating PO, + flatus and no problems voiding.    Objective: Vital signs in last 24 hours: Temp:  [98 F (36.7 C)-102.8 F (39.3 C)] 98.3 F (36.8 C) (07/10 0748) Pulse Rate:  [77-117] 77 (07/10 0748) Resp:  [14-28] 18 (07/10 0748) BP: (95-153)/(64-97) 124/69 (07/10 0748) SpO2:  [91 %-100 %] 100 % (07/10 0748)  Physical Exam:  General: alert, cooperative and appears stated age Lochia: appropriate Uterine Fundus: firm Incision: healing well DVT Evaluation: No evidence of DVT seen on physical exam.  Recent Labs    05/18/20 0818 05/20/20 0453  HGB 11.9* 11.5*  HCT 37.9 35.2*    Assessment/Plan: Status post Cesarean section. Doing well postoperatively.  Remove foley Pumping Circ consent done Ambulate Depo  Donnamae Jude 05/20/2020, 8:15 AM

## 2020-05-20 NOTE — Progress Notes (Signed)
Patient requested to wait till after breakfast to start pumping breast.

## 2020-05-20 NOTE — Op Note (Signed)
Operative Note   SURGERY DATE: 05/20/2020  PRE-OP DIAGNOSIS:  *Pregnancy at 40 weeks *Arrest of Dilation *Chorioamnionitis *Fetal Tachycardia  POST-OP DIAGNOSIS:  *Pregnancy at 40 weeks *Arrest of Dilation *Chorioamnionitis *Fetal Tachycardia   PROCEDURE: primary low transverse cesarean section via pfannenstiel skin incision with double layer uterine closure  SURGEON: Surgeon(s) and Role:    Kennon Rounds, Standley Dakins, MD - Primary    * Cledith Kamiya L, DO - OB Fellow  ASSISTANT: None  ANESTHESIA: epidural  ESTIMATED BLOOD LOSS: 469 mL  DRAINS: 250 mL UOP via indwelling foley  TOTAL IV FLUIDS: 1800 mL crystalloid  VTE PROPHYLAXIS: SCDs to bilateral lower extremities  ANTIBIOTICS: 2 g Ampicillin, 500 mg Azithromycin, within 1 hour of skin incision  SPECIMENS: Placenta to pathology, Arterial Cord gas attempted but unable to be obtained 2/2 lack of cord blood  COMPLICATIONS: Slightly decreased tone requiring 0.2 mg IM methergine, infant apneic at delivery  INDICATIONS: Arrest of dilation at 6 cm >8 hours, Maternal fever resistant to tylenol and antibiotics, Fetal Tachycardia in the setting of chorioamnionitis  FINDINGS: No intra-abdominal adhesions were noted. Grossly normal uterus with 1 small 1.5 cm fibroid noted on the fundus, normal tubes and ovaries. Meconium-stained amniotic fluid, cephalic ROT female infant, weight 3880 gm, APGARs 2/4/5, intact placenta.  PROCEDURE IN DETAIL: The patient was taken to the operating room where her epidural anesthesia was dose to surgical level. She was then prepped and draped in the normal fashion in the dorsal supine position with a leftward tilt.  After a time out was performed, a pfannensteil skin incision was made with the scalpel and carried through to the underlying layer of fascia. The fascia was then incised at the midline and this incision was extended laterally bluntly. Attention was turned to the superior aspect of the fascial incision  which was grasped manually, tented up and the rectus muscles were dissected off bluntly. The rectus muscles were then separated in the midline and the peritoneum was entered bluntly. The Alexis retractor was inserted and the vesicouterine peritoneum was identified.  A low transverse hysterotomy was made with the scalpel until the endometrial cavity was breached and the amniotic sac ruptured, yielding meconium-stained amniotic fluid. This incision was extended bluntly and the infant's head, shoulders and body were delivered atraumatically.The cord was clamped x 2 and cut, and the infant was handed to the awaiting pediatricians, after delayed cord clamping was done.  The placenta was then gradually expressed from the uterus and then the uterus was cleared of all clots and debris. The hysterotomy was repaired with a running suture of 1-0 Monocryl. A second imbricating layer of 1-0 Monocryl suture was then placed, achieving excellent hemostasis.   Due to poor uterine tone, 0.2 mg methergine was given IM and uterine massage was performed.  The hysterotomy and all operative sites were reinspected and excellent hemostasis was noted after irrigation and suction of the abdomen with warm saline.  The peritoneum was closed in a purse-string fashion with 1-0 Monocryl. The fascia was reapproximated with 0 Vicryl in a simple running fashion bilaterally. The skin was then closed with 4-0 Vicryl, in a subcuticular fashion.  The patient  tolerated the procedure well. Sponge, lap, needle, and instrument counts were correct x 2. The patient was transferred to the recovery room awake, alert and breathing independently in stable condition.  Merilyn Baba, DO OB Fellow Center for [employer])

## 2020-05-21 LAB — COMPREHENSIVE METABOLIC PANEL
ALT: 17 U/L (ref 0–44)
AST: 24 U/L (ref 15–41)
Albumin: 1.7 g/dL — ABNORMAL LOW (ref 3.5–5.0)
Alkaline Phosphatase: 157 U/L — ABNORMAL HIGH (ref 38–126)
Anion gap: 5 (ref 5–15)
BUN: 7 mg/dL (ref 6–20)
CO2: 25 mmol/L (ref 22–32)
Calcium: 8.2 mg/dL — ABNORMAL LOW (ref 8.9–10.3)
Chloride: 108 mmol/L (ref 98–111)
Creatinine, Ser: 0.78 mg/dL (ref 0.44–1.00)
GFR calc Af Amer: >60 mL/min (ref >60)
GFR calc non Af Amer: >60 mL/min (ref >60)
Glucose, Bld: 63 mg/dL — ABNORMAL LOW (ref 70–99)
Potassium: 4.3 mmol/L (ref 3.5–5.1)
Sodium: 138 mmol/L (ref 135–145)
Total Bilirubin: 0.7 mg/dL (ref 0.3–1.2)
Total Protein: 4.7 g/dL — ABNORMAL LOW (ref 6.5–8.1)

## 2020-05-21 LAB — TYPE AND SCREEN
ABO/RH(D): O POS
Antibody Screen: NEGATIVE

## 2020-05-21 MED ORDER — SCOPOLAMINE 1 MG/3DAYS TD PT72
MEDICATED_PATCH | TRANSDERMAL | Status: AC
  Filled 2020-05-21: qty 1

## 2020-05-21 MED ORDER — KETOROLAC TROMETHAMINE 30 MG/ML IJ SOLN
INTRAMUSCULAR | Status: AC
  Filled 2020-05-21: qty 1

## 2020-05-21 NOTE — Progress Notes (Signed)
Daily Post Partum Note  05/21/2020 Susan Moore is a 19 y.o. G1P1001  POD#1 s/p pLTCS @ [redacted]w[redacted]d for chorio and arrest of dilation.  Pregnancy c/b gdma2 (metformin) 24hr/overnight events:  Needed I/O cath last night for urinary retention  Subjective:  Pt voided w/o issue this morning, +flatus, taking po w/o issue, pain controlled with PO meds, +ambulation. Newborn is doing okay in the NICU  Objective:    Current Vital Signs 24h Vital Sign Ranges  T (!) 97.5 F (36.4 C) Temp  Avg: 97.4 F (36.3 C)  Min: 96.9 F (36.1 C)  Max: 97.7 F (36.5 C)  BP 107/63 BP  Min: 99/61  Max: 117/74  HR 66 Pulse  Avg: 74.3  Min: 62  Max: 96  RR 18 Resp  Avg: 17.8  Min: 17  Max: 18  SaO2 99 %  (room air) SpO2  Avg: 97.8 %  Min: 96 %  Max: 99 %       24 Hour I/O Current Shift I/O  Time Ins Outs 07/10 0701 - 07/11 0700 In: 2414 [P.O.:1560; I.V.:445.9] Out: 3350 [Urine:3350] No intake/output data recorded.    General: NAD Abdomen: . Firm fundus, nttp, rare BS, moderate distension, c/d/i honeycomb dressing. Skin:  Warm and dry.  Cardiovascular: S1, S2 normal, no murmur, rub or gallop, regular rate and rhythm Respiratory:  Clear to auscultation bilateral. Normal respiratory effort Extremities: warm and dry  Medications Current Facility-Administered Medications  Medication Dose Route Frequency Provider Last Rate Last Admin   acetaminophen (TYLENOL) tablet 1,000 mg  1,000 mg Oral Q6H Sparacino, Hailey L, DO   1,000 mg at 05/21/20 8299   coconut oil  1 application Topical PRN Sparacino, Hailey L, DO       witch hazel-glycerin (TUCKS) pad 1 application  1 application Topical PRN Sparacino, Hailey L, DO       And   dibucaine (NUPERCAINAL) 1 % rectal ointment 1 application  1 application Rectal PRN Sparacino, Hailey L, DO       diphenhydrAMINE (BENADRYL) capsule 25 mg  25 mg Oral Q6H PRN Sparacino, Hailey L, DO       enoxaparin (LOVENOX) injection 45 mg  0.5 mg/kg Subcutaneous Q24H Sparacino,  Hailey L, DO   45 mg at 05/20/20 2058   HYDROmorphone (DILAUDID) injection 0.2-0.6 mg  0.2-0.6 mg Intravenous Q2H PRN Sparacino, Hailey L, DO   0.2 mg at 05/20/20 0654   ibuprofen (ADVIL) tablet 800 mg  800 mg Oral Q6H Sparacino, Hailey L, DO   800 mg at 05/21/20 3716   lactated ringers infusion   Intravenous Continuous Sparacino, Hailey L, DO   Stopped at 05/20/20 0950   medroxyPROGESTERone (DEPO-PROVERA) injection 150 mg  150 mg Intramuscular Once Sparacino, Hailey L, DO       menthol-cetylpyridinium (CEPACOL) lozenge 3 mg  1 lozenge Oral Q2H PRN Sparacino, Hailey L, DO       nalbuphine (NUBAIN) injection 5 mg  5 mg Intravenous Q4H PRN Duane Boston, MD       Or   nalbuphine (NUBAIN) injection 5 mg  5 mg Subcutaneous Q4H PRN Duane Boston, MD       nalbuphine (NUBAIN) injection 5 mg  5 mg Intravenous Once PRN Duane Boston, MD       Or   nalbuphine (NUBAIN) injection 5 mg  5 mg Subcutaneous Once PRN Duane Boston, MD       naloxone [facility] Greaser) injection 0.4 mg  0.4 mg Intravenous PRN Duane Boston, MD  And   sodium chloride flush (NS) 0.9 % injection 3 mL  3 mL Intravenous PRN Duane Boston, MD       naloxone HCl [facility]) 2 mg in dextrose 5 % 250 mL infusion  1-4 mcg/kg/hr Intravenous Continuous PRN Duane Boston, MD       ondansetron [facility]) injection 4 mg  4 mg Intravenous Q8H PRN Duane Boston, MD       oxyCODONE (Oxy IR/ROXICODONE) immediate release tablet 5-10 mg  5-10 mg Oral Q4H PRN Sparacino, Hailey L, DO       prenatal multivitamin tablet 1 tablet  1 tablet Oral Q1200 Sparacino, Hailey L, DO   1 tablet at 05/20/20 1150   scopolamine (TRANSDERM-SCOP) 1 MG/3DAYS 1.5 mg  1 patch Transdermal Once Duane Boston, MD   1.5 mg at 05/20/20 0227   senna-docusate (Senokot-S) tablet 2 tablet  2 tablet Oral Q24H Sparacino, Hailey L, DO   2 tablet at 05/21/20 0039   simethicone (MYLICON) chewable tablet 80 mg  80 mg Oral TID PC Sparacino, Hailey L, DO   80 mg at 05/20/20 1716    simethicone (MYLICON) chewable tablet 80 mg  80 mg Oral Q24H Sparacino, Hailey L, DO   80 mg at 05/21/20 0038   simethicone (MYLICON) chewable tablet 80 mg  80 mg Oral PRN Sparacino, Hailey L, DO       Tdap (BOOSTRIX) injection 0.5 mL  0.5 mL Intramuscular Once Sparacino, Hailey L, DO       zolpidem (AMBIEN) tablet 5 mg  5 mg Oral QHS PRN Sparacino, Hailey L, DO        Labs:  Recent Labs  Lab 05/18/20 0818 05/20/20 0453 05/20/20 1714  WBC 7.5 20.5* 22.1*  HGB 11.9* 11.5* 9.6*  HCT 37.9 35.2* 29.5*  PLT 214 206 166   Recent Labs  Lab 05/18/20 0818 05/20/20 0453  NA 136  --   K 3.8  --   CL 105  --   CO2 20*  --   BUN 8  --   CREATININE 0.84 1.02*  GLUCOSE 138*  --   CALCIUM 8.9  --      Assessment & Plan:  Pt doing well *Postpartum/postop: routine care. D/w mom re: circ (r/b) and she would like one and I told her that we can do when okay with the peds team. O pos. breast *Renal: rpt Cr ordered for today *GDMa2: glucose 83 yesterday *Dispo: likely tomorrow.   Durene Romans MD Attending Center for [facility])

## 2020-05-21 NOTE — Lactation Note (Signed)
This note was copied from a baby's chart. Lactation Consultation Note  Patient Name: Susan Moore [id number] Date: 05/21/2020  Mom reports she still isn't getting anything with pumping. Mom reports she is getting small amount of colostrum with her hands.  Praised moms efforts.  Explained it was normal.  Urged her to keep pumping 8-12 times day for 15 minutes in the initiate setting. Urged her to continue to add massage and hand expression to pumping.  Mom inquired about when milk usually comes in,  Discussued it is different for all moms,  Average is 3-4 days.  Urged STS when able and pumping past STS. Mom reports breasts and nipples feel fine.  Mom plans to get DEBP from [facility]  Mom reports she has not heard from [facility] as of yet. Urged mom to keep up the good work   Discussed donor milk vs formula.  Mom reports she has decided to do donor milk.  Praised her decision and explained it was a good bridge untill she gets her own milk.  Urged her to call lactation as needed.   Maternal Data    Feeding Feeding Type: Donor Breast Milk Nipple Type: Nfant Slow Flow (purple)  LATCH Score                   Interventions    Lactation Tools Discussed/Used     Consult Status      Josanna Hefel Thompson Caul 05/21/2020, 12:08 PM

## 2020-05-22 ENCOUNTER — Ambulatory Visit: Payer: Self-pay

## 2020-05-22 MED ORDER — OXYCODONE HCL 5 MG PO TABS: 5.0000 mg | ORAL_TABLET | ORAL | 0 refills | Status: DC | PRN

## 2020-05-22 MED ORDER — IBUPROFEN 800 MG PO TABS: 800.0000 mg | ORAL_TABLET | Freq: Four times a day (QID) | ORAL | 0 refills | Status: DC

## 2020-05-22 NOTE — Progress Notes (Signed)
Patient screened out for psychosocial assessment since none of the following apply: °Psychosocial stressors documented in mother or baby's chart °Gestation less than 32 weeks °Code at delivery  °Infant with anomalies °Please contact the Clinical Social Worker if specific needs arise, by MOB's request, or if MOB scores greater than 9/yes to question 10 on Edinburgh Postpartum Depression Screen. ° °Laurren Lepkowski Boyd-Gilyard, MSW, LCSW °Clinical Social Work °[phone] °  °

## 2020-05-22 NOTE — Lactation Note (Signed)
This note was copied from a baby's chart. Lactation Consultation Note  Patient Name: Susan Moore IWLNL'G Date: 05/22/2020   [place] attempt to visit with mom in her room but she was in baby's room and being d/c today. [place] visited with mom in baby's room. Mom reports she heard from [facility] and will get [facility] pump tomorrow at 2 pm. Mom reports she does not have a way to pump until then.  Mom reports she will go home today.  Mom has all of her pump parts with her.  Mom reports breasts and nipples feel fine and she is starting to get more volume.  Mom reports got 15 ml last time she pumped. Did not discuss changing setting with mom yet.  Mom has hand pump in her pump parts but was not aware of what it was.  Demo hand pump with mom,  Mom reports she feels like she knows how to use it but did not actually demo on herself. Urged mom to continue to pump every 2 hours during the day and three hours at night.  Urged mom to call lactation as needed.  Maternal Data    Feeding Feeding Type: Donor Breast Milk Nipple Type: Nfant Extra Slow Flow (gold)  LATCH Score                   Interventions    Lactation Tools Discussed/Used     Consult Status      Susan Moore 05/22/2020, 12:05 PM

## 2020-05-22 NOTE — Progress Notes (Signed)
Reviewed discharge instructions with patient. Reviewed medications, when to call MD/go to MAU, signs and symptoms of pre-e, and to call to schedule follow up appointment with OB. Patient asked appropriate questions. Patient to visit NICU then discharge home with family.

## 2020-05-23 LAB — SURGICAL PATHOLOGY

## 2020-05-25 ENCOUNTER — Ambulatory Visit: Payer: Self-pay

## 2020-05-25 NOTE — Lactation Note (Signed)
This note was copied from a baby's chart. Lactation Consultation Note  Patient Name: Susan Moore JSHFW'Y Date: 05/25/2020 Reason for consult: Follow-up assessment;Primapara;1st time breastfeeding;NICU baby;Term  1420 - 1435 - I followed up with Susan Moore and her 30 day old son, Susan Moore. She states that her mature milk began to transition last night. She expressed happiness at seeing it come in. She states that she pumped last night and again this morning.  I reviewed pump settings and showed her how to use the maintenance phase. I also discussed how frequent stimulation and removal of milk would help promote and protect her milk. I recommended that she pump at least 8 times a day using the maintenance phase of her DEBP. I recommended pumping both breasts at the same time and to pump for 15-20 minutes. We discussed going no longer than 4 hours at night without pumping.  Cleaning supplies provided. [name] obtained a DEBP from [facility] for home use. I encouraged her to pump using the Symphony pump provided in the room when she is visiting her son. She has been bringing her manual pump to the NICU.  Maternal Data Does the patient have breastfeeding experience prior to this delivery?: No  Feeding Feeding Type: Donor Breast Milk    Interventions Interventions: Breast feeding basics reviewed;DEBP  Lactation Tools Discussed/Used WIC Program: Yes Pump Review: Setup, frequency, and cleaning;Milk Storage   Consult Status Consult Status: Follow-up Date: 05/27/20 Follow-up type: In-patient    Lenore Manner 05/25/2020, 2:37 PM

## 2020-05-26 ENCOUNTER — Ambulatory Visit: Payer: Self-pay

## 2020-05-26 NOTE — Lactation Note (Signed)
This note was copied from a baby's chart. Lactation Consultation Note  Patient Name: Susan Moore Date: 05/26/2020 Reason for consult: Follow-up assessment  P1 mother whose infant is now 46 days old.  This is a term baby in the NICU.  Baby was asleep in mother's arms when I arrived.  Mother had no questions/concerns at this time.    She now has a full milk supply and informed me that her last pumping session yielded 4 ounces of EBM.  Praised her for her efforts with pumping.  Mother denies pain with pumping and feels like she is using the correct flange size.  She will continue to pump every three hours with one longer interval of four hours during the night as desired.  Mother has a DEBP from the [facility] department.  She will call for any questions/concerns.   Maternal Data    Feeding Feeding Type: Donor Breast Milk Nipple Type: Dr. Lavena Bullion  LATCH Score                   Interventions    Lactation Tools Discussed/Used     Consult Status Consult Status: Follow-up Date: 05/27/20 Follow-up type: In-patient    Janie Strothman R Raif Chachere 05/26/2020, 1:56 PM

## 2020-05-27 ENCOUNTER — Ambulatory Visit: Payer: Self-pay

## 2020-05-27 NOTE — Lactation Note (Signed)
This note was copied from a baby's chart. Lactation Consultation Note  Patient Name: Susan Moore OZDGU'Y Date: 05/27/2020  Followup, moms request  When [facility] arrived mom changing infants diaper.  Infant sucking on a pacifier and cuing and fussing.  Mom reports her nipples started hurting today so she thinks she may need larger flanges.   Mom reports she is fixing to pump and feed him.  Discussed breastfeeding.  Mom reports she tried to latch him the other day and he was unable to.  Mom holding infant now and he is really cuing and wanting to go to breast.  Asked mom if she would like to try and breastfeed him.  Mom said yes.   Lc assisted mom with trying to latch him.  Mom has short shaft nipples and full breasts.  Infant trying to latch.   Coming off and on.  Tried Reverse pressure softening and tcup hold of nipple.  Infant still unable to maintain.  LC attempt 24 mm nipple shield. Infant would latch and suck a time or two and pull off and cry.  Milk visibly in shield.  After a few attempts urged mom to go ahead and feed him  from the bottle.  Mom did that enough to calm him.  He took about an oz and then [facility] and mom  tried again to latch him.  Again he just got fussy.  Cuing, latching to nipple shield but taking 1-2 sucks and letting go and crying.  Urged mom to d/c attempting at this session.  Infant had a small amount of emesis and then got hiccups.   Infant did not want any more from bottle at this time.  Urged mom to try again with breastfeeding but urged before he gets too hungry. Praised her efforts.  Urged her to call lactation as needed.    Maternal Data    Feeding Feeding Type: Breast Milk Nipple Type: Dr. Myra Gianotti [facility]  [facility] Score                   Interventions    Lactation Tools Discussed/Used     Consult Status      Mickey Hebel Thompson Caul 05/27/2020, 7:37 PM

## 2020-06-03 ENCOUNTER — Emergency Department (HOSPITAL_COMMUNITY): Payer: Medicaid Other

## 2020-06-03 ENCOUNTER — Other Ambulatory Visit: Payer: Self-pay

## 2020-06-03 ENCOUNTER — Emergency Department (HOSPITAL_COMMUNITY)
Admission: EM | Admit: 2020-06-03 | Discharge: 2020-06-03 | Disposition: A | Discharge status: Home / Self Care | Payer: Medicaid Other | Attending: Emergency Medicine | Admitting: Emergency Medicine

## 2020-06-03 ENCOUNTER — Encounter (HOSPITAL_COMMUNITY): Payer: Self-pay

## 2020-06-03 DIAGNOSIS — J45909 Unspecified asthma, uncomplicated: Secondary | ICD-10-CM | POA: Diagnosis not present

## 2020-06-03 DIAGNOSIS — J9811 Atelectasis: Secondary | ICD-10-CM | POA: Diagnosis not present

## 2020-06-03 DIAGNOSIS — Z79899 Other long term (current) drug therapy: Secondary | ICD-10-CM | POA: Insufficient documentation

## 2020-06-03 DIAGNOSIS — Z20822 Contact with and (suspected) exposure to covid-19: Secondary | ICD-10-CM | POA: Insufficient documentation

## 2020-06-03 DIAGNOSIS — R000 Tachycardia, unspecified: Secondary | ICD-10-CM | POA: Diagnosis not present

## 2020-06-03 DIAGNOSIS — R918 Other nonspecific abnormal finding of lung field: Secondary | ICD-10-CM | POA: Diagnosis not present

## 2020-06-03 DIAGNOSIS — A419 Sepsis, unspecified organism: Secondary | ICD-10-CM | POA: Diagnosis not present

## 2020-06-03 DIAGNOSIS — R509 Fever, unspecified: Secondary | ICD-10-CM | POA: Diagnosis not present

## 2020-06-03 DIAGNOSIS — R079 Chest pain, unspecified: Secondary | ICD-10-CM | POA: Diagnosis not present

## 2020-06-03 DIAGNOSIS — J90 Pleural effusion, not elsewhere classified: Secondary | ICD-10-CM | POA: Diagnosis not present

## 2020-06-03 DIAGNOSIS — R590 Localized enlarged lymph nodes: Secondary | ICD-10-CM | POA: Diagnosis not present

## 2020-06-03 LAB — COMPREHENSIVE METABOLIC PANEL
ALT: 34 U/L (ref 0–44)
AST: 25 U/L (ref 15–41)
Albumin: 3.2 g/dL — ABNORMAL LOW (ref 3.5–5.0)
Alkaline Phosphatase: 150 U/L — ABNORMAL HIGH (ref 38–126)
Anion gap: 11 (ref 5–15)
BUN: 14 mg/dL (ref 6–20)
CO2: 22 mmol/L (ref 22–32)
Calcium: 9.1 mg/dL (ref 8.9–10.3)
Chloride: 107 mmol/L (ref 98–111)
Creatinine, Ser: 0.95 mg/dL (ref 0.44–1.00)
GFR calc Af Amer: >60 mL/min (ref >60)
GFR calc non Af Amer: >60 mL/min (ref >60)
Glucose, Bld: 80 mg/dL (ref 70–99)
Potassium: 3.8 mmol/L (ref 3.5–5.1)
Sodium: 140 mmol/L (ref 135–145)
Total Bilirubin: 0.5 mg/dL (ref 0.3–1.2)
Total Protein: 8.3 g/dL — ABNORMAL HIGH (ref 6.5–8.1)

## 2020-06-03 LAB — URINALYSIS, ROUTINE W REFLEX MICROSCOPIC
Bacteria, UA: NONE SEEN
Bilirubin Urine: NEGATIVE
Glucose, UA: NEGATIVE mg/dL
Ketones, ur: NEGATIVE mg/dL
Leukocytes,Ua: NEGATIVE
Nitrite: NEGATIVE
Protein, ur: NEGATIVE mg/dL
Specific Gravity, Urine: 1.019 (ref 1.005–1.030)
pH: 6 (ref 5.0–8.0)

## 2020-06-03 LAB — APTT: aPTT: 29 seconds (ref 24–36)

## 2020-06-03 LAB — I-STAT BETA HCG BLOOD, ED (MC, WL, AP ONLY): I-stat hCG, quantitative: <5 m[IU]/mL (ref <5)

## 2020-06-03 LAB — TROPONIN I (HIGH SENSITIVITY)
Troponin I (High Sensitivity): 6 ng/L (ref <18)
Troponin I (High Sensitivity): 12 ng/L (ref <18)

## 2020-06-03 LAB — CBC WITH DIFFERENTIAL/PLATELET
Abs Immature Granulocytes: 0.07 10*3/uL (ref 0.00–0.07)
Basophils Absolute: 0.1 10*3/uL (ref 0.0–0.1)
Basophils Relative: 0 %
Eosinophils Absolute: 0.1 10*3/uL (ref 0.0–0.5)
Eosinophils Relative: 1 %
HCT: 39.4 % (ref 36.0–46.0)
Hemoglobin: 12 g/dL (ref 12.0–15.0)
Immature Granulocytes: 1 %
Lymphocytes Relative: 9 %
Lymphs Abs: 1.3 10*3/uL (ref 0.7–4.0)
MCH: 27.3 pg (ref 26.0–34.0)
MCHC: 30.5 g/dL (ref 30.0–36.0)
MCV: 89.7 fL (ref 80.0–100.0)
Monocytes Absolute: 0.5 10*3/uL (ref 0.1–1.0)
Monocytes Relative: 4 %
Neutro Abs: 12.2 10*3/uL — ABNORMAL HIGH (ref 1.7–7.7)
Neutrophils Relative %: 85 %
Platelets: 625 10*3/uL — ABNORMAL HIGH (ref 150–400)
RBC: 4.39 MIL/uL (ref 3.87–5.11)
RDW: 14.7 % (ref 11.5–15.5)
WBC: 14.2 10*3/uL — ABNORMAL HIGH (ref 4.0–10.5)
nRBC: 0 % (ref 0.0–0.2)

## 2020-06-03 LAB — LACTIC ACID, PLASMA
Lactic Acid, Venous: 1.6 mmol/L (ref 0.5–1.9)
Lactic Acid, Venous: 2.4 mmol/L (ref 0.5–1.9)

## 2020-06-03 LAB — PROTIME-INR
INR: 1.1 (ref 0.8–1.2)
Prothrombin Time: 14.2 seconds (ref 11.4–15.2)

## 2020-06-03 LAB — SARS CORONAVIRUS 2 BY RT PCR (HOSPITAL ORDER, PERFORMED IN ~~LOC~~ HOSPITAL LAB): SARS Coronavirus 2: NEGATIVE

## 2020-06-03 LAB — D-DIMER, QUANTITATIVE: D-Dimer, Quant: 3.57 ug/mL-FEU — ABNORMAL HIGH (ref 0.00–0.50)

## 2020-06-03 MED ORDER — METRONIDAZOLE 500 MG PO TABS
500.0000 mg | ORAL_TABLET | Freq: Once | ORAL | Status: AC
  Administered 2020-06-03: 500 mg via ORAL
  Filled 2020-06-03: qty 1

## 2020-06-03 MED ORDER — AMOXICILLIN-POT CLAVULANATE 875-125 MG PO TABS: 1.0000 | ORAL_TABLET | Freq: Two times a day (BID) | ORAL | 0 refills | Status: AC

## 2020-06-03 MED ORDER — ACETAMINOPHEN 500 MG PO TABS
1000.0000 mg | ORAL_TABLET | Freq: Once | ORAL | Status: AC
  Administered 2020-06-03: 1000 mg via ORAL
  Filled 2020-06-03: qty 2

## 2020-06-03 MED ORDER — METRONIDAZOLE 500 MG PO TABS: 500.0000 mg | ORAL_TABLET | Freq: Two times a day (BID) | ORAL | 0 refills | Status: AC

## 2020-06-03 MED ORDER — LACTATED RINGERS IV BOLUS
1000.0000 mL | Freq: Once | INTRAVENOUS | Status: AC
  Administered 2020-06-03: 1000 mL via INTRAVENOUS

## 2020-06-03 MED ORDER — IOHEXOL 350 MG/ML SOLN
100.0000 mL | Freq: Once | INTRAVENOUS | Status: AC | PRN
  Administered 2020-06-03: 100 mL via INTRAVENOUS

## 2020-06-03 MED ORDER — AMOXICILLIN-POT CLAVULANATE 875-125 MG PO TABS
1.0000 | ORAL_TABLET | Freq: Once | ORAL | Status: AC
  Administered 2020-06-03: 1 via ORAL
  Filled 2020-06-03: qty 1

## 2020-06-03 NOTE — Discharge Instructions (Addendum)
  Please take all of your antibiotics until finished!   You may develop abdominal discomfort or diarrhea from the antibiotic.  You may help offset this with probiotics which you can buy or get in yogurt. Do not eat or take the probiotics until 2 hours after your antibiotic.   Fever: Acetaminophen: May take acetaminophen (generic for Tylenol), as needed, for pain or fever. Your daily total maximum amount of acetaminophen from all sources should be limited to 4000mg /day for persons without liver problems, or 2000mg /day for those with liver problems.  Follow-up: Follow-up with OB/GYN this coming week.  They should contact you to set up an appointment.  If you have not heard from them by the afternoon of Monday, July 26, please make contact with the office.  Return: Should you need to return to the emergency department, [facility]. You should return for increasing abdominal pain, change in vaginal discharge with the fever, persistent vomiting, chest pain, shortness of breath, or any other major concerns.

## 2020-06-03 NOTE — ED Triage Notes (Signed)
Pt arrives to ED w/ c/o 6/10 chest pain worse w/ inspiration. Pt 2 weeks post caesarian section. Pt HR 135, temp 102.9 in triage. Resp e/u.

## 2020-06-03 NOTE — ED Provider Notes (Signed)
[place] EMERGENCY DEPARTMENT Provider Note   CSN: [id number] Arrival date & time: 06/03/20  1315     History Chief Complaint  Patient presents with  . Code Sepsis    Susan Moore is a 19 y.o. female.  Patient who delivered a son by C-section on 05/19/2020 presents the emergency department with complaint of fever, chills, and right axillary pain.  Symptoms began this morning.  She reports pain in the area that is worse with deep breathing.  No shortness of breath or cough.  No ear pain, nasal congestion, runny nose, sore throat.  No nausea, vomiting, or diarrhea.  No known sick contacts.  She has not been immunized against COVID-19.  She denies skin rashes.  No hematuria or irritative UTI symptoms including dysuria, increased frequency or urgency.  Patient denies risk factors for pulmonary embolism including: unilateral leg swelling, history of DVT/PE/other blood clots, use of exogenous hormones, recent immobilizations, recent surgery, recent travel (>4hr segment), malignancy, hemoptysis.  No treatments PTA. She is attempting to breastfeed but mainly pumping.  States that her son was diagnosed with thrush.  She denies warmness, tenderness around the nipple stating that she only has pain in her breast when she needs to pump.         Past Medical History:  Diagnosis Date  . Asthma   . Chlamydia   . GDM (gestational diabetes mellitus) 03/2020    Patient Active Problem List   Diagnosis Date Noted  . Chorioamnionitis, delivered, current hospitalization 05/20/2020  . Gestational diabetes mellitus (GDM) in third trimester controlled on oral hypoglycemic drug 05/18/2020  . Gestational diabetes mellitus (GDM) in third trimester 03/28/2020  . Choroid plexus cyst of fetus affecting care of mother, antepartum 01/06/2020  . 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 Asthma   . Supervision of high risk pregnancy, antepartum 10/26/2019    Past Surgical History:    Procedure Laterality Date  . CESAREAN SECTION  05/19/2020   Procedure: CESAREAN SECTION;  Surgeon: Donnamae Jude, MD;  Location: [facility] LD ORS;  Service: Obstetrics;;  Arrest of Dilation, Fetal Tachycardia  . WISDOM TOOTH EXTRACTION       OB History    Gravida  1   Para      Term      Preterm      AB      Living  0     SAB      TAB      Ectopic      Multiple      Live Births              No family history on file.  Social History   Tobacco Use  . Smoking status: Passive Smoke Exposure - Never Smoker  . Smokeless tobacco: Never Used  Vaping Use  . Vaping Use: Never used  Substance Use Topics  . Alcohol use: No  . Drug use: Not Currently    Types: Marijuana    Comment: last used when found out pregnant    Home Medications Prior to Admission medications   Medication Sig Start Date End Date Taking? Authorizing Provider  albuterol (PROVENTIL) (2.5 MG/3ML) 0.083% nebulizer solution Take 3 mLs (2.5 mg total) by nebulization every 6 (six) hours as needed for wheezing. 04/12/20   [name], MD  albuterol (VENTOLIN HFA) 108 (90 Base) MCG/ACT inhaler Inhale 1-2 puffs into the lungs every 6 (six) hours as needed for wheezing or shortness of breath. 04/12/20  [name], MD  ibuprofen (ADVIL) 800 MG tablet Take 1 tablet (800 mg total) by mouth every 6 (six) hours. 05/22/20   Chancy Milroy, MD  oxyCODONE (OXY IR/ROXICODONE) 5 MG immediate release tablet Take 1 tablet (5 mg total) by mouth every 4 (four) hours as needed for moderate pain. 05/22/20   Chancy Milroy, MD  prenatal vitamin w/FE, FA (PRENATAL 1 + 1) 27-1 MG TABS tablet Take 1 tablet by mouth daily at 12 noon. 09/23/19   Rasch, Artist Pais, NP    Allergies    Patient has no known allergies.  Review of Systems   Review of Systems  Constitutional: Positive for chills and fever. Negative for fatigue.  HENT: Negative for congestion, ear pain, rhinorrhea, sinus pressure and sore throat.   Eyes:  Negative for redness.  Respiratory: Negative for cough and wheezing.   Cardiovascular: Negative for chest pain.  Gastrointestinal: Negative for abdominal pain, diarrhea, nausea and vomiting.  Genitourinary: Negative for dysuria.  Musculoskeletal: Negative for myalgias and neck stiffness.  Skin: Negative for rash.  Neurological: Negative for headaches.  Hematological: Negative for adenopathy.    Physical Exam Updated Vital Signs BP (!) 149/91 (BP Location: Left Arm)   Pulse (!) 135   Temp (!) 102.9 F (39.4 C) (Oral)   Resp 22   Ht 5\' 3"  (1.6 m)   Wt 81.6 kg   LMP 08/12/2019   SpO2 100%   BMI 31.89 kg/m   Physical Exam Vitals and nursing note reviewed.  Constitutional:      Appearance: She is well-developed.  HENT:     Head: Normocephalic and atraumatic.     Jaw: No trismus.     Right Ear: Tympanic membrane, ear canal and external ear normal.     Left Ear: Tympanic membrane, ear canal and external ear normal.     Nose: Nose normal. No mucosal edema or rhinorrhea.     Mouth/Throat:     Mouth: Mucous membranes are not dry. No oral lesions.     Pharynx: Uvula midline. No oropharyngeal exudate, posterior oropharyngeal erythema or uvula swelling.     Tonsils: No tonsillar abscesses.  Eyes:     General:        Right eye: No discharge.        Left eye: No discharge.     Conjunctiva/sclera: Conjunctivae normal.  Cardiovascular:     Rate and Rhythm: Regular rhythm. Tachycardia present.     Heart sounds: Normal heart sounds.  Pulmonary:     Effort: Pulmonary effort is normal. No respiratory distress.     Breath sounds: Normal breath sounds. No wheezing or rales.  Abdominal:     Palpations: Abdomen is soft.     Tenderness: There is no abdominal tenderness.     Comments: Patient with Steri-Strips in place over her cesarean section scar.  No surrounding tenderness.  No active drainage.  Musculoskeletal:     Cervical back: Normal range of motion and neck supple.     Comments:  Tenderness R axilla without swelling, redness, induration or abscess.   Lymphadenopathy:     Cervical: No cervical adenopathy.  Skin:    General: Skin is warm and dry.  Neurological:     Mental Status: She is alert.     ED Results / Procedures / Treatments   Labs (all labs ordered are listed, but only abnormal results are displayed) Labs Reviewed  COMPREHENSIVE METABOLIC PANEL - Abnormal; Notable for the following components:  Result Value   Total Protein 8.3 (*)    Albumin 3.2 (*)    Alkaline Phosphatase 150 (*)    All other components within normal limits  CBC WITH DIFFERENTIAL/PLATELET - Abnormal; Notable for the following components:   WBC 14.2 (*)    Platelets 625 (*)    Neutro Abs 12.2 (*)    All other components within normal limits  D-DIMER, QUANTITATIVE (NOT AT [facility]) - Abnormal; Notable for the following components:   D-Dimer, Quant 3.57 (*)    All other components within normal limits  CULTURE, BLOOD (ROUTINE X 2)  CULTURE, BLOOD (ROUTINE X 2)  URINE CULTURE  SARS CORONAVIRUS 2 BY RT PCR (HOSPITAL ORDER, [facility])  PROTIME-INR  APTT  URINALYSIS, ROUTINE W REFLEX MICROSCOPIC  LACTIC ACID, PLASMA  LACTIC ACID, PLASMA  I-STAT BETA HCG BLOOD, ED ([facility], [facility], [facility] ONLY)  TROPONIN I (HIGH SENSITIVITY)  TROPONIN I (HIGH SENSITIVITY)    ED ECG REPORT   Date: 06/03/2020  Rate: 134  Rhythm: sinus tachycardia  QRS Axis: right  Intervals: normal  ST/T Wave abnormalities: normal  Conduction Disutrbances:none  Narrative Interpretation:   Old EKG Reviewed: none available  I have personally reviewed the EKG tracing and agree with the computerized printout as noted.  Radiology No results found.  Procedures Procedures (including critical care time)  Medications Ordered in ED Medications  acetaminophen (TYLENOL) tablet 1,000 mg (1,000 mg Oral Given 06/03/20 1419)    ED Course  I have reviewed the triage vital signs and the nursing  notes.  Pertinent labs & imaging results that were available during my care of the patient were reviewed by me and considered in my medical decision making (see chart for details).  Patient seen and examined. Work-up initiated. Medications ordered.  On initial exam, patient appears well, nontoxic in appearance.  She is sitting upright, appears comfortable, no respiratory distress.  Sepsis work-up ordered by protocol prior to my evaluation.  Currently unclear etiology of the patient's symptoms.  She does not report any signs and symptoms consistent with mastitis at this time.  Vital signs reviewed and are as follows: BP (!) 149/91 (BP Location: Left Arm)   Pulse (!) 135   Temp (!) 102.9 F (39.4 C) (Oral)   Resp 22   Ht 5\' 3"  (1.6 m)   Wt 81.6 kg   LMP 08/12/2019   SpO2 100%   BMI 31.89 kg/m   2:57 PM Reviewed work-up to this point. D-dimer was ordered by RN on arrival. This is elevated. Awaiting COVID testing.   Signout to [employer] at shift change who will follow-up on complete work-up and dispo appropriately.     MDM Rules/Calculators/A&P                          Pending completion of work-up.    Final Clinical Impression(s) / ED Diagnoses Final diagnoses:  None    Rx / DC Orders ED Discharge Orders    None       Carlisle Cater, Hershal Coria 06/03/20 1512    Davonna Belling, MD 06/03/20 1553

## 2020-06-03 NOTE — ED Provider Notes (Signed)
Susan Moore is a 19 y.o. female, presenting to the ED primarily complaining of chest pain that began this morning around 11 AM.  The pain is mild to moderate, to the right of the sternum, and only present with deep inhalation. She notes soreness around the C-section incision site, however, this has been steadily improving. She has continued vaginal bleeding since the C-section, but this has also been improving.  She is currently using 3-4 pads daily.  There has been no change in the discharge; no change in appearance, amount, or odor.   HPI from Alecia Lemming, PA-C: "Patient who delivered a son by C-section on 05/19/2020 presents the emergency department with complaint of fever, chills, and right axillary pain.  Symptoms began this morning.  She reports pain in the area that is worse with deep breathing.  No shortness of breath or cough.  No ear pain, nasal congestion, runny nose, sore throat.  No nausea, vomiting, or diarrhea.  No known sick contacts.  She has not been immunized against COVID-19.  She denies skin rashes.  No hematuria or irritative UTI symptoms including dysuria, increased frequency or urgency.  Patient denies risk factors for pulmonary embolism including: unilateral leg swelling, history of DVT/PE/other blood clots, use of exogenous hormones, recent immobilizations, recent surgery, recent travel (>4hr segment), malignancy, hemoptysis.  No treatments PTA. She is attempting to breastfeed but mainly pumping.  States that her son was diagnosed with thrush.  She denies warmness, tenderness around the nipple stating that she only has pain in her breast when she needs to pump."  Past Medical History:  Diagnosis Date  . Asthma   . Chlamydia   . GDM (gestational diabetes mellitus) 03/2020    Physical Exam  BP (!) 149/91 (BP Location: Left Arm)   Pulse (!) 135   Temp (!) 102.9 F (39.4 C) (Oral)   Resp 22   Ht 5\' 3"  (1.6 m)   Wt 81.6 kg   LMP 08/12/2019   SpO2 100%   BMI 31.89 kg/m    Physical Exam Vitals and nursing note reviewed. Exam conducted with a chaperone present.  Constitutional:      General: She is not in acute distress.    Appearance: She is well-developed. She is not ill-appearing or diaphoretic.  HENT:     Head: Normocephalic and atraumatic.     Mouth/Throat:     Mouth: Mucous membranes are moist.     Pharynx: Oropharynx is clear.  Eyes:     Conjunctiva/sclera: Conjunctivae normal.  Cardiovascular:     Rate and Rhythm: Normal rate and regular rhythm.     Pulses: Normal pulses.          Radial pulses are 2+ on the right side and 2+ on the left side.       Posterior tibial pulses are 2+ on the right side and 2+ on the left side.     Heart sounds: Normal heart sounds.     Comments: Tactile temperature in the extremities appropriate and equal bilaterally. Pulmonary:     Effort: Pulmonary effort is normal. No respiratory distress.     Breath sounds: Normal breath sounds.  Chest:     Comments: No tenderness, areas of abnormal warmth, erythema, or swelling to the breasts, areolas, or nipples.  She has no tenderness or other abnormalities to the chest wall or axillas. RN, Urban Gibson, chaperone during exam. Abdominal:     Palpations: Abdomen is soft.     Tenderness: There is no abdominal  tenderness. There is no guarding.     Comments: C-section wound appears clean.  Steri-Strips in place.  No purulence, swelling, signs of dehiscence, erythema. No abnormal tenderness.  Musculoskeletal:     Cervical back: Neck supple.     Right lower leg: No edema.     Left lower leg: No edema.  Lymphadenopathy:     Cervical: No cervical adenopathy.  Skin:    General: Skin is warm and dry.  Neurological:     Mental Status: She is alert.  Psychiatric:        Mood and Affect: Mood and affect normal.        Speech: Speech normal.        Behavior: Behavior normal.     ED Course/Procedures     Procedures    Abnormal Labs Reviewed  COMPREHENSIVE METABOLIC PANEL  - Abnormal; Notable for the following components:      Result Value   Total Protein 8.3 (*)    Albumin 3.2 (*)    Alkaline Phosphatase 150 (*)    All other components within normal limits  CBC WITH DIFFERENTIAL/PLATELET - Abnormal; Notable for the following components:   WBC 14.2 (*)    Platelets 625 (*)    Neutro Abs 12.2 (*)    All other components within normal limits  URINALYSIS, ROUTINE W REFLEX MICROSCOPIC - Abnormal; Notable for the following components:   Hgb urine dipstick SMALL (*)    All other components within normal limits  D-DIMER, QUANTITATIVE (NOT AT [facility]) - Abnormal; Notable for the following components:   D-Dimer, Quant 3.57 (*)    All other components within normal limits  LACTIC ACID, PLASMA - Abnormal; Notable for the following components:   Lactic Acid, Venous 2.4 (*)    All other components within normal limits    CT Angio Chest PE W and/or Wo Contrast  Result Date: 06/03/2020 CLINICAL DATA:  Chest pain, pleurisy EXAM: CT ANGIOGRAPHY CHEST WITH CONTRAST TECHNIQUE: Multidetector CT imaging of the chest was performed using the standard protocol during bolus administration of intravenous contrast. Multiplanar CT image reconstructions and MIPs were obtained to evaluate the vascular anatomy. CONTRAST:  178mL OMNIPAQUE IOHEXOL 350 MG/ML SOLN COMPARISON:  None. FINDINGS: Cardiovascular: Satisfactory opacification of the pulmonary arteries to the segmental level. No evidence of pulmonary embolism. Normal heart size. No pericardial effusion. Thoracic aorta is unremarkable. Mediastinum/Nodes: Shotty bilateral axillary adenopathy is noted, nonspecific. No frankly pathologic thoracic adenopathy is present. Esophagus unremarkable. Visualized thyroid is unremarkable. Lungs/Pleura: Small left pleural effusion is present with minimal left basilar compressive atelectasis. The lungs are otherwise clear. No pneumothorax. Central airways are widely patent. Upper Abdomen: No acute  abnormality. Musculoskeletal: No chest wall abnormality. No acute or significant osseous findings. Review of the MIP images confirms the above findings. IMPRESSION: No pulmonary embolism. Small left pleural effusion. No definite radiographic explanation for the patient's reported symptoms. Electronically Signed   By: Fidela Salisbury MD   On: 06/03/2020 18:15   DG Chest Port 1 View  Result Date: 06/03/2020 CLINICAL DATA:  Fever, code sepsis. EXAM: PORTABLE CHEST 1 VIEW COMPARISON:  05/26/2005 FINDINGS: Lungs are adequately inflated as patient is slightly rotated to the right. There is no focal airspace consolidation or effusion. Cardiomediastinal silhouette is normal. Bones and soft tissues are normal. IMPRESSION: No active disease. Electronically Signed   By: Marin Olp M.D.   On: 06/03/2020 15:48       EKG Interpretation  Date/Time:  Saturday June 03 2020  13:23:08 EDT Ventricular Rate:  134 PR Interval:  116 QRS Duration: 66 QT Interval:  296 QTC Calculation: 442 R Axis:   159 Text Interpretation: Sinus tachycardia Right axis deviation Cannot rule out Anterior infarct , age undetermined Abnormal ECG Confirmed by Davonna Belling [phone]) on 06/03/2020 3:30:21 PM       MDM   Clinical Course as of Jun 03 2052  Sat Jun 03, 2020  1714 Spoke with Dr. Harolyn Rutherford, OB/GYN. We discussed the patient's presenting complaint as well as her additional findings of fever and tachycardia.  We discussed the patient's physical exam. She states that the patient is not having abdominal pain or acute tenderness, endometritis is less likely.  Patients with endometritis are usually quite tender. As a precaution, she recommends starting the patient on 10 days of Augmentin and 10 days of Flagyl.  They will follow up with her in the office. We also discussed the patient's presenting blood pressure (higher than normal).  She states no further action is necessary on this in the ED.  They will follow up on this in  the office as well. She will send message for patient to come into office.   [SJ]    Clinical Course User Index [SJ] Lorayne Bender, PA-C    Patient care handoff report received from [facility], [place]. Plan: Patient is at the beginning of her work-up.  She has lab results pending.   Patient presents with sensation of pain to the right of the sternum with deep inspiration beginning this morning. Patient is nontoxic appearing, not tachypneic, not hypotensive, maintains excellent SPO2 on room air, and is in no apparent distress.   I have reviewed the patient's chart to obtain more information.   I reviewed and interpreted the patient's labs and radiological studies.  Patient was noted to be febrile and tachycardic upon presentation in the ED. Mild leukocytosis.  My suspicion for sepsis is low based on patient's presentation. Other than the discomfort with inspiration, patient has no symptoms.  At the time of discharge, patient was mildly tachycardic. She will have close follow-up with OB/GYN.  The patient was given instructions for home care as well as strict return precautions. Patient voices understanding of these instructions, accepts the plan, and is comfortable with discharge.   Findings and plan of care discussed with Virgel Manifold, MD.   Vitals:   06/03/20 1500 06/03/20 1530 06/03/20 1601 06/03/20 1711  BP: 123/79 121/80    Pulse: (!) 115 (!) 112 (!) 143 (!) 114  Resp:      Temp:    99.4 F (37.4 C)  TempSrc:    Oral  SpO2: 97% 100% 95% 97%  Weight:      Height:          Layla Maw 06/03/20 2057    Virgel Manifold, MD 06/03/20 2246

## 2020-06-04 LAB — URINE CULTURE: Culture: <10000 — AB

## 2020-06-05 ENCOUNTER — Telehealth: Payer: Self-pay | Admitting: *Deleted

## 2020-06-05 ENCOUNTER — Other Ambulatory Visit: Payer: Self-pay | Admitting: Lactation Services

## 2020-06-05 MED ORDER — NYSTATIN 100000 UNIT/GM EX CREA: TOPICAL_CREAM | CUTANEOUS | 1 refills | Status: DC

## 2020-06-05 NOTE — Telephone Encounter (Signed)
Contacted pt to complete transition of care assessment; she requests a call back later today.  Lenor Coffin, RN, BSN, [place] Patient [place] [phone]

## 2020-06-05 NOTE — Progress Notes (Signed)
Nystatin ordered for s/s yeast to nipples and infant with thrush per standing order.

## 2020-06-05 NOTE — Telephone Encounter (Signed)
Patient reports infant has thrush and is being treated.  Patient reports her nipples are pink, sore and tender. She is pumping and it is painful. She is not latching the infant as he will not stay latched.   She reports occasional shooting pain in the breast. Not consistent with intraductal yeast at this time.   Reviewed treatment of Thrush with  1. Vinegar wash to nipples post pumping 2. Applying Nystatin Cream to nipples after each pumping 3. Washing bras, breast pads, and burp cloths daily in hot soapy water 3. Changing breast pads with each pumping 4. boiling bottles, pacifiers, and pump parts daily for 20 minutes daily  5. Please let us know if you have any change in symptoms  Patient reports a lump to her c/s incision. She denies drainage, odor, or pain. She has appt tomorrow at 10 am to have looked at.   My chart Message sent to patient about treatement of thrush.

## 2020-06-05 NOTE — Telephone Encounter (Signed)
Transition Care Management Follow-up Telephone Call  . Medicaid Managed Care Transition Call Status:MM [facility] Call Made  . Date of discharge and from where: [facility], 06/03/20  . How have you been since you were released from the hospital? Feeling good . Any questions or concerns? No, lump on incision; pt has appt for incision check 06/06/20  Items Reviewed: Marland Kitchen Did the pt receive and understand the discharge instructions provided? Yes  . Medications obtained and verified? Yes  . Any new allergies since your discharge? No  . Dietary orders reviewed? Yes . Do you have support at home?  Yes, family  Functional Questionnaire: (I = Independent and D = Dependent)  ADLs: Independent Bathing/Dressing:Independent Meal Prep: Independent Eating: Independent Maintaining continence: Independent Transferring/Ambulation: Independent Managing Meds: Independent Follow up appointments reviewed:  PCP Hospital f/u appt confirmed? Yes  Scheduled to see [facility] Nurse on 7//27/21 @ [facility] f/u appt confirmed? Scheduled to see Gavin Pound on 06/22/20 @ 0835.  Are transportation arrangements needed? No   If their condition worsens, is the pt aware to call PCP or go to the EmergencyDept.? yes Was the patient provided with contact information for the PCP's office or ED? yes  Was to pt encouraged to call back with questions or concerns? Yes  Lenor Coffin, RN, BSN, [place] Patient [place] [phone]

## 2020-06-06 ENCOUNTER — Ambulatory Visit (INDEPENDENT_AMBULATORY_CARE_PROVIDER_SITE_OTHER): Payer: Medicaid Other

## 2020-06-06 ENCOUNTER — Other Ambulatory Visit: Payer: Self-pay

## 2020-06-06 VITALS — BP 115/70 | HR 71 | Wt 176.9 lb

## 2020-06-06 DIAGNOSIS — Z5189 Encounter for other specified aftercare: Secondary | ICD-10-CM

## 2020-06-06 NOTE — Progress Notes (Signed)
Pt here today for incision check s/p c-section on 05/20/20.  Pt denies any pain or vaginal bleeding.  Removed steri strips.  Incision well approximated- no erythema, no odor, no drainage, mild edema on right of incision.  Pt advised to continue to monitor for sx's of infection.  Allow warm, soapy water to run over the incision and pat dry.  Pt encouraged to call the office with any concerns and that we will f/u with her on pp visit on 06/22/20.  Pt verbalized understanding with no further questions.   Mel Almond, RN  06/06/20

## 2020-06-07 NOTE — Progress Notes (Signed)
Patient was assessed and managed by nursing staff during this encounter. I have reviewed the chart and agree with the documentation and plan. I have also made any necessary editorial changes.  Mora Bellman, MD 06/07/2020 5:40 PM

## 2020-06-08 LAB — CULTURE, BLOOD (ROUTINE X 2)
Culture: NO GROWTH
Culture: NO GROWTH

## 2020-06-22 ENCOUNTER — Other Ambulatory Visit (HOSPITAL_COMMUNITY)
Admission: RE | Admit: 2020-06-22 | Discharge: 2020-06-22 | Disposition: A | Discharge status: Home / Self Care | Payer: Medicaid Other | Source: Ambulatory Visit

## 2020-06-22 ENCOUNTER — Ambulatory Visit (INDEPENDENT_AMBULATORY_CARE_PROVIDER_SITE_OTHER): Payer: Medicaid Other

## 2020-06-22 ENCOUNTER — Other Ambulatory Visit: Payer: Self-pay

## 2020-06-22 DIAGNOSIS — Z124 Encounter for screening for malignant neoplasm of cervix: Secondary | ICD-10-CM

## 2020-06-22 DIAGNOSIS — Z8632 Personal history of gestational diabetes: Secondary | ICD-10-CM

## 2020-06-22 NOTE — Patient Instructions (Signed)
Glucose Tolerance Test Why am I having this test? The glucose tolerance test (GTT) is done to check how your body processes sugar (glucose). This is one of several tests used to diagnose diabetes (diabetes mellitus). Your health care provider may recommend this test if you:  Have a family history of diabetes.  Are very overweight (obese).  Have infections that keep coming back (recurring).  Have had a lot of wounds that did not heal quickly, especially on your legs and feet.  Are a woman and have a history of giving birth to very large babies or a history of repeated fetal loss (stillbirth).  Have had high glucose levels in your urine or blood: ? During a past pregnancy. ? After a heart attack, surgery, or prolonged periods of high stress. What is being tested? This test measures the amount of glucose in your blood at different times during a period of 2 hours. This indicates how well your body is able to process glucose. What kind of sample is taken?  Blood samples are required for this test. They are usually collected by inserting a needle into a blood vessel. How do I prepare for this test?  For 3 days before your test, eat normally. Have plenty of carbohydrate-rich foods.  Follow instructions from your health care provider about: ? Eating or drinking restrictions on the day of the test. You may be asked to not eat or drink anything other than water (fast) starting 8-12 hours before the test. ? Changing or stopping your regular medicines. Some medicines may interfere with this test. Tell a health care provider about:  All medicines you are taking, including vitamins, herbs, eye drops, creams, and over-the-counter medicines.  Any blood disorders you have.  Any surgeries you have had.  Any medical conditions you have.  Whether you are pregnant or may be pregnant. What happens during the test? First, your blood glucose will be measured. This is referred to as your fasting  blood glucose, since you fasted before the test. Then, you will drink a glucose solution that contains a certain amount of glucose. Your blood glucose will be measured again 1 and 2 hours after drinking the solution. This test takes 2 hours to complete. You will need to stay at the testing location during this time. During the testing period:  Do not eat or drink anything other than the glucose solution. You will be allowed to drink water.  Do not exercise.  Do not use any products that contain nicotine or tobacco, such as cigarettes and e-cigarettes. If you need help stopping, ask your health care provider. The testing procedure may vary among health care providers and hospitals. How are the results reported? Your results will be reported as milligrams of glucose per deciliter of blood (mg/dL) or millimoles per liter (mmol/L). Your health care provider will compare your results to normal ranges that were established after testing a large group of people (reference ranges). Reference ranges may vary among labs and hospitals. For this test, common reference ranges are:  Fasting: less than 110 mg/dL (6.1 mmol/L).  1 hour after drinking glucose: less than 180 mg/dL (10.0 mmol/L).  2 hours after drinking glucose: less than 140 mg/dL (7.8 mmol/L). What do the results mean? Results that are within the reference ranges are considered normal, meaning that your glucose levels are well controlled. Results higher than the reference ranges may mean that you recently experienced stress, such as from an injury or a sudden (acute) condition like a  heart attack or stroke, or that you have:  Diabetes.  Cushing syndrome.  Tumors such as pheochromocytoma or glucagonoma.  Kidney failure.  Pancreatitis.  Hyperthyroidism.  An infection. Talk with your health care provider about what your results mean. Questions to ask your health care provider Ask your health care provider, or the department that is  doing the test:  When will my results be ready?  How will I get my results?  What are my treatment options?  What other tests do I need?  What are my next steps? Summary  The glucose tolerance test (GTT) is done to check how your body processes sugar (glucose). This is one of several tests used to diagnose diabetes (diabetes mellitus).  This test measures the amount of glucose in your blood at different times during a period of 2 hours. This indicates how well your body is able to process glucose.  Talk with your health care provider about what your results mean. This information is not intended to replace advice given to you by your health care provider. Make sure you discuss any questions you have with your health care provider. Document Revised: 10/10/2017 Document Reviewed: 06/09/2017 Elsevier Patient Education  [place].

## 2020-06-22 NOTE — Progress Notes (Signed)
[place] Partum Visit Note  Susan Moore is a 19 y.o. G1P0 female who presents for a postpartum visit. She is 4 weeks postpartum following a primary cesarean section.  I have fully reviewed the prenatal and intrapartum course. The delivery was at 40 gestational weeks.  Anesthesia: epidural. Postpartum course has been uncomplciated. Baby is doing well. Baby is feeding by breast. Bleeding no bleeding. Bowel function is normal. Bladder function is normal. Patient is not sexually active. Contraception method is none. Postpartum depression screening: negative.  The pregnancy intention screening data noted above was reviewed. Potential methods of contraception were discussed. The patient elected to proceed with Abstinence  Patient reports living at home with mother and younger brother who both provide support and assist with infant care.  Patient reports she works from home and has returned to work. Patient denies feelings of depression and endorses safety at home. Patient reports FOB remains incarcerated and she is unsure of when he will be released.    Edinburgh Postnatal Depression Scale - 06/22/20 0911      Edinburgh Postnatal Depression Scale:  In the Past 7 Days   I have been able to laugh and see the funny side of things. 0    I have looked forward with enjoyment to things. 0    I have blamed myself unnecessarily when things went wrong. 0    I have been anxious or worried for no good reason. 0    I have felt scared or panicky for no good reason. 0    Things have been getting on top of me. 0    I have been so unhappy that I have had difficulty sleeping. 0    I have felt sad or miserable. 0    I have been so unhappy that I have been crying. 0    The thought of harming myself has occurred to me. 0    Edinburgh Postnatal Depression Scale Total 0          The following portions of the patient's history were reviewed and updated as appropriate: allergies, current medications, past family  history, past medical history, past social history, past surgical history and problem list.  Review of Systems Pertinent items are noted in HPI.    Objective:  Blood pressure 114/72, pulse 97, height 5\' 3"  (1.6 m), weight 174 lb 1.6 oz (79 kg), last menstrual period 08/12/2019, currently breastfeeding.  General:  alert, cooperative and no distress   Breasts:  Not Examined  Lungs: clear to auscultation bilaterally  Heart:  regular rate and rhythm  Abdomen: soft, non-tender; bowel sounds normal; no masses,  no organomegaly   Vulva:  normal  Vagina: normal vagina and Scant amt thin white discharge.  Cervix:  Pink, No lesions, cysts or polyps.  No apparent discharge from OS.  Pap collected with brush and spatula.   Corpus: normal size, contour, position, consistency, mobility, non-tender  Adnexa:  not evaluated  Rectal Exam: Not performed.        Assessment:    4 week postpartum exam Cervical Cancer Screening H/O GDM-A1  Plan:   -Exam performed and findings discussed. -Educated on ASCCP guidelines regarding pap smear evaluation and frequency. -Informed of turnover time and provider/clinic policy on releasing results. -Discussed need for lab visit for completion of GTT.  Plan to return in 7-10 days. -Informed of need for fasting diet prior to arrival.  -Given information for PCP for initiation of care outside of women's health and reproduction.  Essential components of care per ACOG recommendations:  1.  Mood and well being: Patient with negative depression screening today. Reviewed local resources for support.  - Patient does not use tobacco.  - hx of drug use? No    2. Infant care and feeding:   -Patient currently breastmilk feeding? Yes  If breastmilk feeding discussed return to work and pumping. If needed, patient was provided letter for work to allow for every 2-3 hr pumping breaks, and to be granted a private location to express breastmilk and refrigerated area to store  breastmilk. Reviewed importance of draining breast regularly to support lactation. -Social determinants of health (SDOH) reviewed in EPIC. No concerns  3. Sexuality, contraception and birth spacing - Patient does not want a pregnancy in the next year.  Desired family size is 1 children.  - Reviewed forms of contraception in tiered fashion. Patient desired abstinence today.   - Discussed birth spacing of 18 months  4. Sleep and fatigue -Encouraged family/partner/community support of 4 hrs of uninterrupted sleep to help with mood and fatigue  5. Physical Recovery  - Discussed patients delivery and complications - Patient had a primary c/s.  - Patient has urinary incontinence? No - Patient is safe to resume physical and sexual activity  6.  Health Maintenance - Last pap smear done today and is pending. No history of Mammogram d/t age  69. No Chronic Disease - Give information for PCP to initiate care.   Maryann Conners, [place] for [employer], [place]

## 2020-06-22 NOTE — Progress Notes (Signed)
Undecided on [facility]

## 2020-06-23 LAB — CYTOLOGY - PAP: Diagnosis: NEGATIVE

## 2020-06-26 ENCOUNTER — Other Ambulatory Visit: Payer: Self-pay | Admitting: *Deleted

## 2020-06-26 DIAGNOSIS — O24429 Gestational diabetes mellitus in childbirth, unspecified control: Secondary | ICD-10-CM

## 2020-06-29 ENCOUNTER — Other Ambulatory Visit: Payer: Medicaid Other

## 2020-06-30 ENCOUNTER — Other Ambulatory Visit: Payer: Medicaid Other

## 2020-07-04 ENCOUNTER — Other Ambulatory Visit: Payer: Self-pay

## 2020-07-04 ENCOUNTER — Other Ambulatory Visit: Payer: Medicaid Other

## 2020-07-04 DIAGNOSIS — O24429 Gestational diabetes mellitus in childbirth, unspecified control: Secondary | ICD-10-CM | POA: Diagnosis not present

## 2020-07-05 LAB — GLUCOSE TOLERANCE, 2 HOURS
Glucose, 2 hour: 73 mg/dL (ref 65–139)
Glucose, GTT - Fasting: 83 mg/dL (ref 65–99)

## 2020-07-08 ENCOUNTER — Other Ambulatory Visit: Payer: Self-pay

## 2020-07-08 ENCOUNTER — Encounter (HOSPITAL_COMMUNITY): Payer: Self-pay | Admitting: Emergency Medicine

## 2020-07-08 ENCOUNTER — Emergency Department (HOSPITAL_COMMUNITY)
Admission: EM | Admit: 2020-07-08 | Discharge: 2020-07-08 | Disposition: A | Discharge status: Home / Self Care | Payer: Medicaid Other | Attending: Emergency Medicine | Admitting: Emergency Medicine

## 2020-07-08 DIAGNOSIS — Z5321 Procedure and treatment not carried out due to patient leaving prior to being seen by health care provider: Secondary | ICD-10-CM | POA: Diagnosis not present

## 2020-07-08 DIAGNOSIS — N644 Mastodynia: Secondary | ICD-10-CM | POA: Diagnosis not present

## 2020-07-08 DIAGNOSIS — L539 Erythematous condition, unspecified: Secondary | ICD-10-CM | POA: Diagnosis not present

## 2020-07-08 NOTE — ED Notes (Signed)
Called for Pt to update vitals. No answer

## 2020-07-08 NOTE — ED Triage Notes (Signed)
C/o L breast pain and redness x 3 days.  Pt breastfeeds.

## 2020-07-08 NOTE — ED Notes (Signed)
Called to update vitals. No answer. Moving pt OTF.

## 2020-07-08 NOTE — ED Notes (Signed)
No answer for vitals  

## 2020-07-09 ENCOUNTER — Emergency Department (HOSPITAL_COMMUNITY)
Admission: EM | Admit: 2020-07-09 | Discharge: 2020-07-09 | Disposition: A | Discharge status: Home / Self Care | Payer: Medicaid Other | Attending: Emergency Medicine | Admitting: Emergency Medicine

## 2020-07-09 ENCOUNTER — Other Ambulatory Visit: Payer: Self-pay

## 2020-07-09 ENCOUNTER — Encounter (HOSPITAL_COMMUNITY): Payer: Self-pay

## 2020-07-09 DIAGNOSIS — Z79899 Other long term (current) drug therapy: Secondary | ICD-10-CM | POA: Insufficient documentation

## 2020-07-09 DIAGNOSIS — N644 Mastodynia: Secondary | ICD-10-CM | POA: Diagnosis present

## 2020-07-09 DIAGNOSIS — Z20822 Contact with and (suspected) exposure to covid-19: Secondary | ICD-10-CM | POA: Diagnosis not present

## 2020-07-09 DIAGNOSIS — R000 Tachycardia, unspecified: Secondary | ICD-10-CM | POA: Diagnosis not present

## 2020-07-09 DIAGNOSIS — Z7722 Contact with and (suspected) exposure to environmental tobacco smoke (acute) (chronic): Secondary | ICD-10-CM | POA: Insufficient documentation

## 2020-07-09 DIAGNOSIS — N61 Mastitis without abscess: Secondary | ICD-10-CM | POA: Insufficient documentation

## 2020-07-09 DIAGNOSIS — J45909 Unspecified asthma, uncomplicated: Secondary | ICD-10-CM | POA: Diagnosis not present

## 2020-07-09 LAB — CBC WITH DIFFERENTIAL/PLATELET
Abs Immature Granulocytes: 0.04 10*3/uL (ref 0.00–0.07)
Basophils Absolute: 0 10*3/uL (ref 0.0–0.1)
Basophils Relative: 0 %
Eosinophils Absolute: 0.3 10*3/uL (ref 0.0–0.5)
Eosinophils Relative: 3 %
HCT: 37.1 % (ref 36.0–46.0)
Hemoglobin: 11.9 g/dL — ABNORMAL LOW (ref 12.0–15.0)
Immature Granulocytes: 0 %
Lymphocytes Relative: 9 %
Lymphs Abs: 1 10*3/uL (ref 0.7–4.0)
MCH: 27.5 pg (ref 26.0–34.0)
MCHC: 32.1 g/dL (ref 30.0–36.0)
MCV: 85.7 fL (ref 80.0–100.0)
Monocytes Absolute: 0.8 10*3/uL (ref 0.1–1.0)
Monocytes Relative: 7 %
Neutro Abs: 9.3 10*3/uL — ABNORMAL HIGH (ref 1.7–7.7)
Neutrophils Relative %: 81 %
Platelets: 318 10*3/uL (ref 150–400)
RBC: 4.33 MIL/uL (ref 3.87–5.11)
RDW: 14.7 % (ref 11.5–15.5)
WBC: 11.4 10*3/uL — ABNORMAL HIGH (ref 4.0–10.5)
nRBC: 0 % (ref 0.0–0.2)

## 2020-07-09 LAB — COMPREHENSIVE METABOLIC PANEL
ALT: 34 U/L (ref 0–44)
AST: 23 U/L (ref 15–41)
Albumin: 3.6 g/dL (ref 3.5–5.0)
Alkaline Phosphatase: 129 U/L — ABNORMAL HIGH (ref 38–126)
Anion gap: 12 (ref 5–15)
BUN: 13 mg/dL (ref 6–20)
CO2: 21 mmol/L — ABNORMAL LOW (ref 22–32)
Calcium: 8.8 mg/dL — ABNORMAL LOW (ref 8.9–10.3)
Chloride: 103 mmol/L (ref 98–111)
Creatinine, Ser: 0.97 mg/dL (ref 0.44–1.00)
GFR calc Af Amer: >60 mL/min (ref >60)
GFR calc non Af Amer: >60 mL/min (ref >60)
Glucose, Bld: 141 mg/dL — ABNORMAL HIGH (ref 70–99)
Potassium: 3 mmol/L — ABNORMAL LOW (ref 3.5–5.1)
Sodium: 136 mmol/L (ref 135–145)
Total Bilirubin: 0.8 mg/dL (ref 0.3–1.2)
Total Protein: 7.8 g/dL (ref 6.5–8.1)

## 2020-07-09 LAB — PROTIME-INR
INR: 1.2 (ref 0.8–1.2)
Prothrombin Time: 14.5 seconds (ref 11.4–15.2)

## 2020-07-09 LAB — SARS CORONAVIRUS 2 BY RT PCR (HOSPITAL ORDER, PERFORMED IN ~~LOC~~ HOSPITAL LAB): SARS Coronavirus 2: NEGATIVE

## 2020-07-09 LAB — LACTIC ACID, PLASMA: Lactic Acid, Venous: 1.6 mmol/L (ref 0.5–1.9)

## 2020-07-09 LAB — APTT: aPTT: 36 seconds (ref 24–36)

## 2020-07-09 MED ORDER — LACTATED RINGERS IV SOLN: INTRAVENOUS | Status: DC

## 2020-07-09 MED ORDER — CLINDAMYCIN PHOSPHATE 600 MG/50ML IV SOLN
600.0000 mg | Freq: Once | INTRAVENOUS | Status: AC
  Administered 2020-07-09: 600 mg via INTRAVENOUS
  Filled 2020-07-09: qty 50

## 2020-07-09 MED ORDER — LACTATED RINGERS IV BOLUS (SEPSIS)
1000.0000 mL | Freq: Once | INTRAVENOUS | Status: AC
  Administered 2020-07-09: 1000 mL via INTRAVENOUS

## 2020-07-09 MED ORDER — POTASSIUM CHLORIDE CRYS ER 20 MEQ PO TBCR
40.0000 meq | EXTENDED_RELEASE_TABLET | Freq: Once | ORAL | Status: AC
  Administered 2020-07-09: 40 meq via ORAL
  Filled 2020-07-09: qty 2

## 2020-07-09 MED ORDER — CLINDAMYCIN HCL 150 MG PO CAPS: 450.0000 mg | ORAL_CAPSULE | Freq: Three times a day (TID) | ORAL | 0 refills | Status: AC

## 2020-07-09 MED ORDER — KETOROLAC TROMETHAMINE 15 MG/ML IJ SOLN
15.0000 mg | Freq: Once | INTRAMUSCULAR | Status: AC
  Administered 2020-07-09: 15 mg via INTRAVENOUS
  Filled 2020-07-09: qty 1

## 2020-07-09 MED ORDER — CLINDAMYCIN HCL 300 MG PO CAPS
450.0000 mg | ORAL_CAPSULE | Freq: Once | ORAL | Status: AC
  Administered 2020-07-09: 450 mg via ORAL
  Filled 2020-07-09: qty 1

## 2020-07-09 NOTE — ED Triage Notes (Signed)
Pt coming in c/o pain to left breast. States there is drainage, tender to touch and red/swollen. Temp of 103.1 here and took tylenol 1 hr pta. No n/v/d

## 2020-07-09 NOTE — ED Provider Notes (Signed)
[facility] DEPT Provider Note   CSN: [id number] Arrival date & time: 07/09/20  1922     History Chief Complaint  Patient presents with  . Breast Problem    Susan Moore is a 19 y.o. female.  HPI  Patient is a 19 year old female with PMH sig for asthma, gest DM   She is presenting today for 3-4 days of left breast pain that has progressively worsened. She endorses fever, chills, achy constant pain that is worse with touch and movement of breast. She states pain is 10/10 associated with fever, chills and fatigue. She denies any NVD, abd pain or chest pain. She states she feels weak but has had no syncope or near syncope.   She has not checked her temp at home and has taken no meds PTA. She has been using nystatin cream on her left nipple which she states has been irritated for a week or so. She had thrush on her nipple from breast feeding. She states she is planning to stop breast feeding but has continued until today when she saw pus coming out of her nipple.        Past Medical History:  Diagnosis Date  . Asthma   . Chlamydia   . GDM (gestational diabetes mellitus) 03/2020  . 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 Gestational diabetes mellitus (GDM) in third trimester controlled on oral hypoglycemic drug 05/18/2020    Patient Active Problem List   Diagnosis Date Noted  . Asthma     Past Surgical History:  Procedure Laterality Date  . CESAREAN SECTION  05/19/2020   Procedure: CESAREAN SECTION;  Surgeon: Donnamae Jude, MD;  Location: [facility] ORS;  Service: Obstetrics;;  Arrest of Dilation, Fetal Tachycardia  . WISDOM TOOTH EXTRACTION       OB History    Gravida  1   Para      Term      Preterm      AB      Living  0     SAB      TAB      Ectopic      Multiple      Live Births               No family history on file.  Social History   Tobacco Use  . Smoking status: Passive Smoke Exposure - Never Smoker  . Smokeless tobacco: Never Used  Vaping Use  . Vaping Use: Never used  Substance Use Topics  . Alcohol use: No  . Drug use: Not Currently    Types: Marijuana    Comment: last used when found out pregnant    Home Medications Prior to Admission medications   Medication Sig Start Date End Date Taking? Authorizing Provider  albuterol (PROVENTIL) (2.5 MG/3ML) 0.083% nebulizer solution Take 3 mLs (2.5 mg total) by nebulization every 6 (six) hours as needed for wheezing. 04/12/20   [name], MD  albuterol (VENTOLIN HFA) 108 (90 Base) MCG/ACT inhaler Inhale 1-2 puffs into the lungs every 6 (six) hours as needed for wheezing or shortness of breath. 04/12/20   [name], MD  clindamycin (CLEOCIN) 150 MG capsule Take 3 capsules (450 mg total) by mouth every 8 (eight) hours for 10 days. 07/09/20 07/19/20  Tedd Sias, PA  ibuprofen (ADVIL)  800 MG tablet Take 1 tablet (800 mg total) by mouth every 6 (six) hours. 05/22/20   Chancy Milroy, MD  nystatin cream (MYCOSTATIN) Apply thin layer to nipples after each pumping session or breast feeding session. Continue until all symptoms are gone for 1-2 weeks. 06/05/20   Donnamae Jude, MD  oxyCODONE (OXY IR/ROXICODONE) 5 MG immediate release tablet Take 1 tablet (5 mg total) by mouth every 4 (four) hours as needed for moderate pain. Patient not taking: Reported on 06/06/2020 05/22/20   Chancy Milroy, MD  prenatal vitamin w/FE, FA (PRENATAL 1 + 1) 27-1 MG TABS tablet Take 1 tablet by mouth daily at 12 noon. 09/23/19   Rasch, Artist Pais, NP    Allergies    Patient has no known allergies.  Review of Systems   Review of Systems  Constitutional: Positive for chills, fatigue and fever.  HENT: Negative for congestion.   Eyes: Negative for pain.  Respiratory: Negative for cough and shortness of breath.   Cardiovascular:  Negative for chest pain and leg swelling.  Gastrointestinal: Negative for abdominal pain and vomiting.  Genitourinary: Negative for dysuria.  Musculoskeletal: Negative for myalgias.       Left breast pain and nipple pain with discharge  Skin: Negative for rash.  Neurological: Positive for weakness. Negative for dizziness and headaches.    Physical Exam Updated Vital Signs BP 112/71   Pulse 100   Temp 98.6 F (37 C) (Oral)   Resp 16   SpO2 100%   Physical Exam Vitals and nursing note reviewed.  Constitutional:      General: She is not in acute distress. HENT:     Head: Normocephalic and atraumatic.     Nose: Nose normal.  Eyes:     General: No scleral icterus. Cardiovascular:     Rate and Rhythm: Normal rate and regular rhythm.     Pulses: Normal pulses.     Heart sounds: Normal heart sounds.     Comments: HR 130 BL DP/PT/R pulses 3+ Pulmonary:     Effort: Pulmonary effort is normal. No respiratory distress.     Breath sounds: No wheezing.  Abdominal:     Palpations: Abdomen is soft.     Tenderness: There is no abdominal tenderness.  Musculoskeletal:     Cervical back: Normal range of motion.     Right lower leg: No edema.     Left lower leg: No edema.  Skin:    General: Skin is warm and dry.     Capillary Refill: Capillary refill takes less than 2 seconds.     Comments: TTP and warmth to touch   Neurological:     Mental Status: She is alert. Mental status is at baseline.  Psychiatric:        Mood and Affect: Mood normal.        Behavior: Behavior normal.     ED Results / Procedures / Treatments   Labs (all labs ordered are listed, but only abnormal results are displayed) Labs Reviewed  COMPREHENSIVE METABOLIC PANEL - Abnormal; Notable for the following components:      Result Value   Potassium 3.0 (*)    CO2 21 (*)    Glucose, Bld 141 (*)    Calcium 8.8 (*)    Alkaline Phosphatase 129 (*)    All other components within normal limits  CBC WITH  DIFFERENTIAL/PLATELET - Abnormal; Notable for the following components:   WBC 11.4 (*)    Hemoglobin  11.9 (*)    Neutro Abs 9.3 (*)    All other components within normal limits  SARS CORONAVIRUS 2 BY RT PCR (HOSPITAL ORDER, [facility])  CULTURE, BLOOD (ROUTINE X 2)  CULTURE, BLOOD (ROUTINE X 2)  LACTIC ACID, PLASMA  PROTIME-INR  APTT    EKG EKG Interpretation  Date/Time:  Sunday July 09 2020 19:40:33 EDT Ventricular Rate:  126 PR Interval:    QRS Duration: 88 QT Interval:  272 QTC Calculation: 394 R Axis:   -86 Text Interpretation: Sinus tachycardia Probable left atrial enlargement Left axis deviation RSR' in V1 or V2, probably normal variant T wave abnormality Abnormal ECG Confirmed by Carmin Muskrat [phone]) on 07/09/2020 7:57:14 PM   Radiology No results found.  Procedures Ultrasound ED Soft Tissue  Date/Time: 07/10/2020 3:59 PM Performed by: Tedd Sias, PA Authorized by: Tedd Sias, PA   Procedure details:    Indications: localization of abscess and evaluate for cellulitis     Transverse view:  Visualized   Longitudinal view:  Visualized   Images: not archived     Limitations:  Body habitus Location:    Location: breast     Side:  Left Findings:     no abscess present   (including critical care time)  Medications Ordered in ED Medications  lactated ringers bolus 1,000 mL (0 mLs Intravenous Stopped 07/09/20 2149)    And  lactated ringers bolus 1,000 mL (0 mLs Intravenous Stopped 07/09/20 2149)  clindamycin (CLEOCIN) IVPB 600 mg (0 mg Intravenous Stopped 07/09/20 2124)  ketorolac (TORADOL) 15 MG/ML injection 15 mg (15 mg Intravenous Given 07/09/20 2053)  potassium chloride SA (KLOR-CON) CR tablet 40 mEq (40 mEq Oral Given 07/09/20 2219)  clindamycin (CLEOCIN) capsule 450 mg (450 mg Oral Given 07/09/20 2240)    ED Course  I have reviewed the triage vital signs and the nursing notes.  Pertinent labs & imaging results  that were available during my care of the patient were reviewed by me and considered in my medical decision making (see chart for details).    MDM Rules/Calculators/A&P                          Patient is a 19 year old female with PMH detailed above presenting today for left breast pain, fevers, chills, and fatigue. Sx for 3-4 days. Pus per nipple.   PE notable for very tender warm breast with nipple DC that is purulent.  Bedside US with no clear distinct fluid/abscess collection. Shared decision with patient she would prefer not to attempt percutaneous drainage at this time -- given [place] I doubt this would be successful.   I discussed this case with my attending physician who cosigned this note including patient's presenting symptoms, physical exam, and planned diagnostics and interventions. Attending physician stated agreement with plan or made changes to plan which were implemented.   Received Clinda IV 600 loading dose and 2L LR and APAP/toradol with complete normalization of vital signs.   Covid - negative CBC with mild leukocytosis no significant anemia, CMP with mildly low K -- oral repletion given. She will recheck with PCP. Lactic WNLs. EKG without ischemia or abnormality.   DC with clindamycin -- will follow up iwht OBGYN or PCP. Given return precautions. Tachycardia resolved with fluids and antipyretics.   Patient understands need to use warm compresses/abx/hydration/followup/and continue pumping/dumping her breast milk.   Final Clinical Impression(s) / ED Diagnoses Final  diagnoses:  Mastitis    Rx / DC Orders ED Discharge Orders         Ordered    clindamycin (CLEOCIN) 150 MG capsule  Every 8 hours        07/09/20 2214           Pati Gallo [place], [place] 07/10/20 1559    Carmin Muskrat, MD 07/13/20 1007

## 2020-07-09 NOTE — Discharge Instructions (Signed)
As discussed please take antibiotics as prescribed every 8 hours.  Please drink plenty of water.  Please use Tylenol 1000 mg every 6 hours.  You may also use ibuprofen 600 mg every 6 hours and alternate between the 2 medicines.  As you are taking clindamycin I recommend "pump and dump" of your breastmilk.  It is very important for you to continue to lactate from your left breast as this will help express the pus out of the infected area of your breast.  Please follow-up with your PCP or OB/GYN.

## 2020-07-10 ENCOUNTER — Telehealth: Payer: Self-pay | Admitting: *Deleted

## 2020-07-10 NOTE — Telephone Encounter (Signed)
Susan Moore presented to the ED and left before being seen by the provider on 07/08/20. The patient has been enrolled in an automated general discharge outreach program and 2 attempts to contact the patient will be made to follow up on their ED visit and subsequent needs. The care management team is available to provide assistance to this patient at any time.   Lenor Coffin, RN, BSN, [facility] Patient [place] [phone]

## 2020-07-11 ENCOUNTER — Telehealth: Payer: Self-pay | Admitting: *Deleted

## 2020-07-11 DIAGNOSIS — N61 Mastitis without abscess: Secondary | ICD-10-CM

## 2020-07-11 NOTE — Telephone Encounter (Addendum)
Contacted patient to complete Transition of Care Assessment: Transition Care Management Follow-up Telephone Call  Date of discharge and from where: 07/08/20, [facility]  How have you been since you were released from the hospital? "fever on 8/30/21of 104.1. and chills". She states she is feeling better today. The patient says that her first dose of antibiotic was on 06/30/20. She had an episode of emesis after the first dose.  Any questions or concerns? Yes  Items Reviewed:  Did the pt receive and understand the discharge instructions provided? Yes   Medications obtained and verified? Yes   Any new allergies since your discharge? No   Dietary orders reviewed? No  Do you have support at home? Yes   Functional Questionnaire: (I = Independent and D = Dependent) ADLs: I  Bathing/Dressing- I  Meal Prep- I  Eating- I  Maintaining continence- I  Transferring/Ambulation- I  Managing Meds- I  Follow up appointments reviewed:   PCP Hospital f/u appt confirmed? Yes  Scheduled to see [facility] Provider on 07/12/20 @ 0845.  [facility] f/u appt confirmed? No     Are transportation arrangements needed? No   If their condition worsens, is the pt aware to call PCP or go to the Emergency Dept.? Yes   Was the patient provided with contact information for the PCP's office or ED? Yes  Was to pt encouraged to call back with questions or concerns? Yes   Patient advised to monitor her temperature and symptoms, and to take those readings to appt on 07/12/20. Order placed for community care coordination , CM for further discussion of symptoms, and 2 or more ED visits in less than 2 weeks.Lenor Coffin, RN, BSN, [facility] Patient [place] [phone]

## 2020-07-12 ENCOUNTER — Other Ambulatory Visit: Payer: Self-pay

## 2020-07-12 ENCOUNTER — Ambulatory Visit (INDEPENDENT_AMBULATORY_CARE_PROVIDER_SITE_OTHER): Payer: Medicaid Other | Admitting: Nurse Practitioner

## 2020-07-12 VITALS — BP 118/78 | HR 50 | Temp 97.1°F | Ht 63.0 in | Wt 181.0 lb

## 2020-07-12 DIAGNOSIS — N61 Mastitis without abscess: Secondary | ICD-10-CM | POA: Diagnosis not present

## 2020-07-12 NOTE — Progress Notes (Signed)
@Patient  ID: Susan Moore, female    DOB: Jan 19, 2001, 19 y.o.   MRN: [id number]  Chief Complaint  Patient presents with   Follow-up    Mastitis; Still taking clindamycin. fever of 104 yesterday took tylenol with relief.     Referring provider: No ref. provider found   19 year old female with history of asthma and gestational diabetes.  Recent significant events:  07/09/2020 ED visit: Patient was seen in the ED and diagnosed with mastitis.  She was treated with IV clindamycin and sent home with a prescription for clindamycin orally.  HPI  Patient presents for transition of care visit.  Patient was seen in the ED on 07/09/2020 and was diagnosed with mastitis of the left breast.  Patient was treated in the ED with IV clindamycin and sent home with prescription for oral clindamycin.  Patient states that she has been compliant with taking clindamycin at home.  She did spike a fever yesterday but after taking Tylenol she states that this did resolve.  She is afebrile this morning.  Vital signs are stable.  She states that she does feel that she is improving.  Patient is currently working from home.  Patient does have Medicaid.  She does not have a PCP and we discussed that we will get her set up with a PCP to establish care at the end of her visit today.  Patient does have a follow-up visit scheduled with her OB/GYN in 1 week.  Patient is currently breast-feeding but for the past several days has been pumping and discarding her breast milk.  She does state that she has issues with some nipple soreness.  Patient does have a history of asthma and states that she does have an albuterol inhaler to use as needed.  She states that she seldom needs to use this.  She denies any recent shortness of breath or wheezing.  Patient states that she does have family support at home.  She states that she is eating and drinking well.  She has not had any issues getting her medications.  She did receive and understand  the discharge instructions provided in the ED.  Home care instructions for mastitis will be printed with after visit summary today as well.Denies f/c/s, n/v/d, hemoptysis, PND, chest pain or edema.        No Known Allergies  Immunization History  Administered Date(s) Administered   Influenza,inj,Quad PF,6+ Mos 10/27/2019   Tdap 03/03/2020    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   + Alpha Thalassemia carrier Increased SMA risk  Info for partner testing given 1/27 [x]  genetic counseling with MFM 01/20/20   Gestational diabetes mellitus (GDM) in third trimester controlled on oral hypoglycemic drug 05/18/2020    Tobacco History: Social History   Tobacco Use  Smoking Status Passive Smoke Exposure - Never Smoker  Smokeless Tobacco Never Used   Counseling given: Yes   Outpatient Encounter Medications as of 07/12/2020  Medication Sig   albuterol (PROVENTIL) (2.5 MG/3ML) 0.083% nebulizer solution Take 3 mLs (2.5 mg total) by nebulization every 6 (six) hours as needed for wheezing.   albuterol (VENTOLIN HFA) 108 (90 Base) MCG/ACT inhaler Inhale 1-2 puffs into the lungs every 6 (six) hours as needed for wheezing or shortness of breath.   clindamycin (CLEOCIN) 150 MG capsule Take 3 capsules (450 mg total) by mouth every  8 (eight) hours for 10 days.   nystatin cream (MYCOSTATIN) Apply thin layer to nipples after each pumping session or breast feeding session. Continue until all symptoms are gone for 1-2 weeks.   prenatal vitamin w/FE, FA (PRENATAL 1 + 1) 27-1 MG TABS tablet Take 1 tablet by mouth daily at 12 noon.   ibuprofen (ADVIL) 800 MG tablet Take 1 tablet (800 mg total) by mouth every 6 (six) hours. (Patient not taking: Reported on 07/12/2020)   [DISCONTINUED] oxyCODONE (OXY IR/ROXICODONE) 5 MG immediate release tablet Take 1 tablet (5 mg total) by mouth every  4 (four) hours as needed for moderate pain. (Patient not taking: Reported on 06/06/2020)   No facility-administered encounter medications on file as of 07/12/2020.     Review of Systems  Review of Systems  Constitutional: Negative.  Negative for chills and fever.  HENT: Negative.   Respiratory: Negative for cough and shortness of breath.   Cardiovascular: Negative.   Gastrointestinal: Negative.   Skin:       Warmth and tenderness improving to left breast   Allergic/Immunologic: Negative.   Neurological: Negative.   Psychiatric/Behavioral: Negative.        Physical Exam  BP 118/78 (BP Location: Right Arm)    Pulse (!) 50    Temp (!) 97.1 F (36.2 C)    Ht 5\' 3"  (1.6 m)    Wt 181 lb 0.1 oz (82.1 kg)    SpO2 97%    Breastfeeding Yes Comment: Pumping   BMI 32.06 kg/m   Wt Readings from Last 5 Encounters:  07/12/20 181 lb 0.1 oz (82.1 kg)  06/22/20 174 lb 1.6 oz (79 kg)  06/06/20 176 lb 14.4 oz (80.2 kg)  06/03/20 180 lb (81.6 kg)  05/18/20 195 lb 1.6 oz (88.5 kg)     Physical Exam Vitals and nursing note reviewed.  Constitutional:      General: She is not in acute distress.    Appearance: She is well-developed.  Cardiovascular:     Rate and Rhythm: Normal rate and regular rhythm.  Pulmonary:     Effort: Pulmonary effort is normal.     Breath sounds: Normal breath sounds.  Chest:     Breasts:        Left: Swelling and tenderness present.       Comments: Tenderness and warmth to the touch to left breast noted. No discharge or drainage noted.  Neurological:     Mental Status: She is alert and oriented to person, place, and time.       Assessment & Plan:   Acute mastitis Assessment: Warmth and tenderness to left breast noted, but appears to be improving. Patient is compliant with medications.  Plan:  May use warm wet compresses 3 times per day  Wash with dial antibacterial soap  May apply lanolin to nipples for comfort  Stay well hydrated  Complete  entire course of Clindamycin  May take tylenol or motrin for pain or fever  Please keep upcoming appointment with OB/GYN for post partum care and follow up on mastitis  Will make appointment to establish care with PCP    Follow up:  As needed    Patient Instructions  Mastitis:  May use warm wet compresses 3 times per day  Wash with dial antibacterial soap  May apply lanolin to nipples for comfort  Stay well hydrated  Complete entire course of Clindamycin  May take tylenol or motrin for pain or fever  Please keep upcoming appointment  with OB/GYN for post partum care and follow up on mastitis  Will make appointment to establish care with PCP    Follow up:  As needed   Mastitis  Mastitis is irritation and swelling (inflammation) in an area of the breast. It is often caused by an infection that occurs when germs (bacteria) enter the skin. This most often happens to breastfeeding mothers, but it can happen to other women too as well as some men. Follow these instructions at home: Medicines  Take over-the-counter and prescription medicines only as told by your doctor.  If you were prescribed an antibiotic medicine, take it as told by your doctor. Do not stop taking it even if you start to feel better. General instructions  Do not wear a tight or underwire bra. Wear a soft support bra.  Drink more fluids, especially if you have a fever.  Get plenty of rest. If you are breastfeeding:   Keep emptying your breasts by breastfeeding or by using a breast pump.  Keep your nipples clean and dry.  During breastfeeding, empty the first breast before going to the other breast. Use a breast pump if your baby is not emptying your breasts.  Massage your breasts during feeding or pumping as told by your doctor.  If told, put moist heat on the affected area of your breast right before breastfeeding or pumping. Use the heat source that your doctor tells you to use.  If  told, put ice on the affected area of your breast right after breastfeeding or pumping: ? Put ice in a plastic bag. ? Place a towel between your skin and the bag. ? Leave the ice on for 20 minutes.  If you go back to work, pump your breasts while at work.  Avoid letting your breasts get overly filled with milk (engorged). Contact a doctor if:  You have pus-like fluid leaking from your breast.  You have a fever.  Your symptoms do not get better within 2 days. Get help right away if:  Your pain and swelling are getting worse.  Your pain is not helped by medicine.  You have a red line going from your breast toward your armpit. Summary  Mastitis is irritation and swelling in an area of the breast.  If you were prescribed an antibiotic medicine, do not stop taking it even if you start to feel better.  Drink more fluids and get plenty of rest.  Contact a doctor if your symptoms do not get better within 2 days. This information is not intended to replace advice given to you by your health care provider. Make sure you discuss any questions you have with your health care provider. Document Revised: 10/10/2017 Document Reviewed: 11/19/2016 Elsevier Patient Education  2020 [place], [place] 07/12/2020

## 2020-07-12 NOTE — Patient Instructions (Signed)
Mastitis:  May use warm wet compresses 3 times per day  Wash with dial antibacterial soap  May apply lanolin to nipples for comfort  Stay well hydrated  Complete entire course of Clindamycin  May take tylenol or motrin for pain or fever  Please keep upcoming appointment with OB/GYN for post partum care and follow up on mastitis  Will make appointment to establish care with PCP    Follow up:  As needed   Mastitis  Mastitis is irritation and swelling (inflammation) in an area of the breast. It is often caused by an infection that occurs when germs (bacteria) enter the skin. This most often happens to breastfeeding mothers, but it can happen to other women too as well as some men. Follow these instructions at home: Medicines  Take over-the-counter and prescription medicines only as told by your doctor.  If you were prescribed an antibiotic medicine, take it as told by your doctor. Do not stop taking it even if you start to feel better. General instructions  Do not wear a tight or underwire bra. Wear a soft support bra.  Drink more fluids, especially if you have a fever.  Get plenty of rest. If you are breastfeeding:   Keep emptying your breasts by breastfeeding or by using a breast pump.  Keep your nipples clean and dry.  During breastfeeding, empty the first breast before going to the other breast. Use a breast pump if your baby is not emptying your breasts.  Massage your breasts during feeding or pumping as told by your doctor.  If told, put moist heat on the affected area of your breast right before breastfeeding or pumping. Use the heat source that your doctor tells you to use.  If told, put ice on the affected area of your breast right after breastfeeding or pumping: ? Put ice in a plastic bag. ? Place a towel between your skin and the bag. ? Leave the ice on for 20 minutes.  If you go back to work, pump your breasts while at work.  Avoid letting your  breasts get overly filled with milk (engorged). Contact a doctor if:  You have pus-like fluid leaking from your breast.  You have a fever.  Your symptoms do not get better within 2 days. Get help right away if:  Your pain and swelling are getting worse.  Your pain is not helped by medicine.  You have a red line going from your breast toward your armpit. Summary  Mastitis is irritation and swelling in an area of the breast.  If you were prescribed an antibiotic medicine, do not stop taking it even if you start to feel better.  Drink more fluids and get plenty of rest.  Contact a doctor if your symptoms do not get better within 2 days. This information is not intended to replace advice given to you by your health care provider. Make sure you discuss any questions you have with your health care provider. Document Revised: 10/10/2017 Document Reviewed: 11/19/2016 Elsevier Patient Education  2020 [employer].

## 2020-07-12 NOTE — Assessment & Plan Note (Addendum)
Assessment: Warmth and tenderness to left breast noted, but appears to be improving. Patient is compliant with medications.  Plan:  May use warm wet compresses 3 times per day  Wash with dial antibacterial soap  May apply lanolin to nipples for comfort  Stay well hydrated  Complete entire course of Clindamycin  May take tylenol or motrin for pain or fever  Please keep upcoming appointment with OB/GYN for post partum care and follow up on mastitis  Will make appointment to establish care with PCP    Follow up:  As needed

## 2020-07-14 ENCOUNTER — Other Ambulatory Visit: Payer: Self-pay

## 2020-07-14 NOTE — Patient Outreach (Signed)
Care Coordination  07/14/2020  Susan Moore [birth year] [id number]   New referral:[place] Managed Medicaid  ED visit with diagnosis of mastitis.  Placed call to patient with no answer and voicemail is not set up.  PLAN: will mail outreach letter and attempt another call in 3-4 business days.  Tomasa Rand, RN, BSN, CEN [facility] [employer] [phone]

## 2020-07-15 LAB — CULTURE, BLOOD (ROUTINE X 2)
Culture: NO GROWTH
Culture: NO GROWTH
Special Requests: ADEQUATE

## 2020-07-19 ENCOUNTER — Other Ambulatory Visit: Payer: Self-pay

## 2020-07-19 NOTE — Patient Outreach (Signed)
Care Coordination  07/19/2020  Love Chowning 2001-01-18 [id number]   Managed medicaid:  Placed call to patient for 2nd outreach attempt with no answer. No voicemail set up.  PLAN: Will attempt 3rd outreach attempt in 3-4 business days.  Tomasa Rand, RN, BSN, CEN [facility] [employer] [phone]

## 2020-07-20 ENCOUNTER — Encounter: Payer: Self-pay | Admitting: General Practice

## 2020-07-24 ENCOUNTER — Other Ambulatory Visit: Payer: Self-pay

## 2020-07-24 NOTE — Patient Outreach (Signed)
Care Coordination  07/24/2020  Susan Moore [birth year] [id number]   Managed medicaid:  Placed call to patient with no answer. No voice mail.  PLAN:  This is the 3rd unsuccessful outreach attempt. Letter already mail. Will attempt again in 1 month.  Tomasa Rand, RN, BSN, CEN [facility] [employer] [phone]

## 2020-08-03 ENCOUNTER — Other Ambulatory Visit: Payer: Self-pay

## 2020-08-03 ENCOUNTER — Encounter: Payer: Self-pay | Admitting: Nurse Practitioner

## 2020-08-03 ENCOUNTER — Ambulatory Visit (INDEPENDENT_AMBULATORY_CARE_PROVIDER_SITE_OTHER): Payer: Medicaid Other | Admitting: Nurse Practitioner

## 2020-08-03 VITALS — BP 115/65 | HR 110 | Temp 97.2°F | Ht 63.0 in | Wt 184.8 lb

## 2020-08-03 DIAGNOSIS — Z7689 Persons encountering health services in other specified circumstances: Secondary | ICD-10-CM

## 2020-08-03 DIAGNOSIS — Z131 Encounter for screening for diabetes mellitus: Secondary | ICD-10-CM | POA: Diagnosis not present

## 2020-08-03 DIAGNOSIS — Z6832 Body mass index (BMI) 32.0-32.9, adult: Secondary | ICD-10-CM

## 2020-08-03 LAB — POCT URINALYSIS DIPSTICK
Bilirubin, UA: NEGATIVE
Glucose, UA: NEGATIVE
Ketones, UA: NEGATIVE
Leukocytes, UA: NEGATIVE
Nitrite, UA: NEGATIVE
Protein, UA: NEGATIVE
Spec Grav, UA: 1.025 (ref 1.010–1.025)
Urobilinogen, UA: 0.2 E.U./dL
pH, UA: 6.5 (ref 5.0–8.0)

## 2020-08-03 NOTE — Progress Notes (Signed)
[address] [place], [place]  [id number] Phone:  [phone]   Fax:  [phone]   New Patient Office Visit  Subjective:  Patient ID: Susan Moore, female    DOB: 05/24/2001  Age: 19 y.o. MRN: [id number]  CC:  Chief Complaint  Patient presents with   Establish Care    HPI Susan Moore presents to establish care. 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   She is working at [employer]. She will be returning to work. She is no longer breastfeeding. She is doing well overall. She denies any concerns today. She is currently living with her mother . She is not on any birth control.    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 No pertinent family history.  Social History   Socioeconomic History   Marital status: Single    Spouse name: Not on file   Number of children: Not on file   Years of education: Not on file   Highest education level: Not on file  Occupational History   Not on file  Tobacco Use   Smoking status: Passive Smoke Exposure - Never Smoker   Smokeless tobacco: Never Used  Vaping Use     Vaping Use: Never used  Substance and Sexual Activity   Alcohol use: No   Drug use: Not Currently    Types: Marijuana    Comment: last used when found out pregnant   Sexual activity: Not Currently    Birth control/protection: None  Other Topics Concern   Not on file  Social History Narrative   Not on file   Social Determinants of Health   Financial Resource Strain:    Difficulty of Paying Living Expenses: Not on file  Food Insecurity: No Food Insecurity   Worried About Running Out of Food in the Last Year: Never true   [place] in the Last Year: Never true  Transportation Needs: No Transportation Needs   Lack of Transportation (Medical): No   Lack of Transportation (Non-Medical): No  Physical Activity:    Days of Exercise per Week: Not on file   Minutes of Exercise per Session: Not on file  Stress:    Feeling of Stress : Not on file  Social Connections:    Frequency of Communication with Friends and Family: Not on file   Frequency of Social Gatherings with Friends and Family: Not on file   Attends Religious Services: Not on file   Active Member of Clubs or Organizations: Not on file   Attends Archivist Meetings: Not on file   Marital Status: Not on file  Intimate Partner Violence:  Fear of Current or Ex-Partner: Not on file   Emotionally Abused: Not on file   Physically Abused: Not on file   Sexually Abused: Not on file    ROS Review of Systems  Constitutional: Negative.   HENT: Negative.   Eyes: Negative.   Respiratory: Negative.  Negative for shortness of breath.   Cardiovascular: Negative.  Negative for chest pain.  Gastrointestinal: Negative.  Negative for constipation, diarrhea, nausea and vomiting.  Endocrine: Negative.   Genitourinary: Negative.   Musculoskeletal: Negative.   Skin: Negative.  Negative for rash.  Allergic/Immunologic: Negative.   Neurological: Negative.   Hematological: Negative.    Psychiatric/Behavioral: Negative.     Objective:   Today's Vitals: BP 115/65    Pulse (!) 110    Temp (!) 97.2 F (36.2 C) (Temporal)    Ht 5\' 3"  (1.6 m)    Wt 184 lb 12.8 oz (83.8 kg)    LMP 07/26/2020    SpO2 99%    BMI 32.74 kg/m   Physical Exam Constitutional:      Appearance: She is obese.  HENT:     Head: Normocephalic and atraumatic.     Nose: Nose normal.     Mouth/Throat:     Mouth: Mucous membranes are moist.  Cardiovascular:     Rate and Rhythm: Normal rate and regular rhythm.     Pulses: Normal pulses.     Heart sounds: Normal heart sounds.  Pulmonary:     Effort: Pulmonary effort is normal.     Breath sounds: Normal breath sounds.  Abdominal:     General: Bowel sounds are normal.     Palpations: Abdomen is soft.  Musculoskeletal:        General: Normal range of motion.     Cervical back: Normal range of motion.  Skin:    General: Skin is warm.     Capillary Refill: Capillary refill takes less than 2 seconds.  Neurological:     General: No focal deficit present.     Mental Status: She is alert and oriented to person, place, and time.  Psychiatric:        Mood and Affect: Mood normal.        Behavior: Behavior normal.        Thought Content: Thought content normal.        Judgment: Judgment normal.     Assessment & Plan:   Problem List Items Addressed This Visit    None    Visit Diagnoses    Screening for diabetes mellitus    - Discussed DM and risk secondary to gestational diabetes risk   Relevant Orders   Microalbumin, urine (Completed)   POCT urinalysis dipstick (Completed)   Encounter to establish care     Discussed birthcontrol and health maintenance for her age group   BMI 32.0-32.9,adult     Obesity with BMI  Discussed proper diet (low fat, low sodium, high fiber) with patient.   Discussed need for regular exercise (3 times per week, 20 minutes per session) with patient.       Outpatient Encounter Medications as of 08/03/2020   Medication Sig   albuterol (PROVENTIL) (2.5 MG/3ML) 0.083% nebulizer solution Take 3 mLs (2.5 mg total) by nebulization every 6 (six) hours as needed for wheezing.   albuterol (VENTOLIN HFA) 108 (90 Base) MCG/ACT inhaler Inhale 1-2 puffs into the lungs every 6 (six) hours as needed for wheezing or shortness of breath.   prenatal vitamin w/FE, FA (PRENATAL  1 + 1) 27-1 MG TABS tablet Take 1 tablet by mouth daily at 12 noon.   [DISCONTINUED] ibuprofen (ADVIL) 800 MG tablet Take 1 tablet (800 mg total) by mouth every 6 (six) hours. (Patient not taking: Reported on 07/12/2020)   [DISCONTINUED] nystatin cream (MYCOSTATIN) Apply thin layer to nipples after each pumping session or breast feeding session. Continue until all symptoms are gone for 1-2 weeks. (Patient not taking: Reported on 08/03/2020)   No facility-administered encounter medications on file as of 08/03/2020.    Follow-up: Return in about 6 months (around 01/31/2021).   Vevelyn Francois, NP

## 2020-08-03 NOTE — Patient Instructions (Signed)
Healthy Eating Following a healthy eating pattern may help you to achieve and maintain a healthy body weight, reduce the risk of chronic disease, and live a long and productive life. It is important to follow a healthy eating pattern at an appropriate calorie level for your body. Your nutritional needs should be met primarily through food by choosing a variety of nutrient-rich foods. What are tips for following this plan? Reading food labels  Read labels and choose the following: ? Reduced or low sodium. ? Juices with 100% fruit juice. ? Foods with low saturated fats and high polyunsaturated and monounsaturated fats. ? Foods with whole grains, such as whole wheat, cracked wheat, brown rice, and wild rice. ? Whole grains that are fortified with folic acid. This is recommended for women who are pregnant or who want to become pregnant.  Read labels and avoid the following: ? Foods with a lot of added sugars. These include foods that contain brown sugar, corn sweetener, corn syrup, dextrose, fructose, glucose, high-fructose corn syrup, honey, invert sugar, lactose, malt syrup, maltose, molasses, raw sugar, sucrose, trehalose, or turbinado sugar.  Do not eat more than the following amounts of added sugar per day:  6 teaspoons (25 g) for women.  9 teaspoons (38 g) for men. ? Foods that contain processed or refined starches and grains. ? Refined grain products, such as white flour, degermed cornmeal, white bread, and white rice. Shopping  Choose nutrient-rich snacks, such as vegetables, whole fruits, and nuts. Avoid high-calorie and high-sugar snacks, such as potato chips, fruit snacks, and candy.  Use oil-based dressings and spreads on foods instead of solid fats such as butter, stick margarine, or cream cheese.  Limit pre-made sauces, mixes, and "instant" products such as flavored rice, instant noodles, and ready-made pasta.  Try more plant-protein sources, such as tofu, tempeh, black beans,  edamame, lentils, nuts, and seeds.  Explore eating plans such as the Mediterranean diet or vegetarian diet. Cooking  Use oil to saut or stir-fry foods instead of solid fats such as butter, stick margarine, or lard.  Try baking, boiling, grilling, or broiling instead of frying.  Remove the fatty part of meats before cooking.  Steam vegetables in water or broth. Meal planning   At meals, imagine dividing your plate into fourths: ? One-half of your plate is fruits and vegetables. ? One-fourth of your plate is whole grains. ? One-fourth of your plate is protein, especially lean meats, poultry, eggs, tofu, beans, or nuts.  Include low-fat dairy as part of your daily diet. Lifestyle  Choose healthy options in all settings, including home, work, school, restaurants, or stores.  Prepare your food safely: ? Wash your hands after handling raw meats. ? Keep food preparation surfaces clean by regularly washing with hot, soapy water. ? Keep raw meats separate from ready-to-eat foods, such as fruits and vegetables. ? Cook seafood, meat, poultry, and eggs to the recommended internal temperature. ? Store foods at safe temperatures. In general:  Keep cold foods at 59F (4.4C) or below.  Keep hot foods at 159F (60C) or above.  Keep your freezer at [facility] (-17.8C) or below.  Foods are no longer safe to eat when they have been between the temperatures of 40-159F (4.4-60C) for more than 2 hours. What foods should I eat? Fruits Aim to eat 2 cup-equivalents of fresh, canned (in natural juice), or frozen fruits each day. Examples of 1 cup-equivalent of fruit include 1 small apple, 8 large strawberries, 1 cup canned fruit,  cup  dried fruit, or 1 cup 100% juice. Vegetables Aim to eat 2-3 cup-equivalents of fresh and frozen vegetables each day, including different varieties and colors. Examples of 1 cup-equivalent of vegetables include 2 medium carrots, 2 cups raw, leafy greens, 1 cup chopped  vegetable (raw or cooked), or 1 medium baked potato. Grains Aim to eat 6 ounce-equivalents of whole grains each day. Examples of 1 ounce-equivalent of grains include 1 slice of bread, 1 cup ready-to-eat cereal, 3 cups popcorn, or  cup cooked rice, pasta, or cereal. Meats and other proteins Aim to eat 5-6 ounce-equivalents of protein each day. Examples of 1 ounce-equivalent of protein include 1 egg, 1/2 cup nuts or seeds, or 1 tablespoon (16 g) peanut butter. A cut of meat or fish that is the size of a deck of cards is about 3-4 ounce-equivalents.  Of the protein you eat each week, try to have at least 8 ounces come from seafood. This includes salmon, trout, herring, and anchovies. Dairy Aim to eat 3 cup-equivalents of fat-free or low-fat dairy each day. Examples of 1 cup-equivalent of dairy include 1 cup (240 mL) milk, 8 ounces (250 g) yogurt, 1 ounces (44 g) natural cheese, or 1 cup (240 mL) fortified soy milk. Fats and oils  Aim for about 5 teaspoons (21 g) per day. Choose monounsaturated fats, such as canola and olive oils, avocados, peanut butter, and most nuts, or polyunsaturated fats, such as sunflower, corn, and soybean oils, walnuts, pine nuts, sesame seeds, sunflower seeds, and flaxseed. Beverages  Aim for six 8-oz glasses of water per day. Limit coffee to three to five 8-oz cups per day.  Limit caffeinated beverages that have added calories, such as soda and energy drinks.  Limit alcohol intake to no more than 1 drink a day for nonpregnant women and 2 drinks a day for men. One drink equals 12 oz of beer (355 mL), 5 oz of wine (148 mL), or 1 oz of hard liquor (44 mL). Seasoning and other foods  Avoid adding excess amounts of salt to your foods. Try flavoring foods with herbs and spices instead of salt.  Avoid adding sugar to foods.  Try using oil-based dressings, sauces, and spreads instead of solid fats. This information is based on general [place] nutrition guidelines. For more  information, visit Build[employer].es. Exact amounts may vary based on your nutrition needs. Summary  A healthy eating plan may help you to maintain a healthy weight, reduce the risk of chronic diseases, and stay active throughout your life.  Plan your meals. Make sure you eat the right portions of a variety of nutrient-rich foods.  Try baking, boiling, grilling, or broiling instead of frying.  Choose healthy options in all settings, including home, work, school, restaurants, or stores. This information is not intended to replace advice given to you by your health care provider. Make sure you discuss any questions you have with your health care provider. Document Revised: 02/09/2018 Document Reviewed: 02/09/2018 Elsevier Patient Education  [place].

## 2020-08-04 LAB — MICROALBUMIN, URINE: Microalbumin, Urine: 12.4 ug/mL

## 2020-08-09 ENCOUNTER — Encounter: Payer: Self-pay | Admitting: Obstetrics and Gynecology

## 2020-08-09 ENCOUNTER — Other Ambulatory Visit: Payer: Self-pay

## 2020-08-09 ENCOUNTER — Other Ambulatory Visit: Payer: Self-pay | Admitting: Obstetrics and Gynecology

## 2020-08-09 NOTE — Patient Outreach (Signed)
   Care Coordination - Case Manager  08/09/2020  [name] Feb 03, 2001 [id number]  Subjective:  Susan Moore is an 19 y.o. year old female who is a primary patient of Vevelyn Francois, NP.  [name] was given information about Medicaid Managed Care team care coordination services today. [name] agreed to services and verbal consent obtained  Review of patient status, laboratory and other test data was performed as part of evaluation for provision of services.  SDOH: SDOH Screenings   Alcohol Screen:   . Last Alcohol Screening Score (AUDIT): Not on file  Depression (PHQ2-9): Low Risk   . PHQ-2 Score: 0  Financial Resource Strain:   . Difficulty of Paying Living Expenses: Not on file  Food Insecurity: No Food Insecurity  . Worried About Charity fundraiser in the Last Year: Never true  . Ran Out of Food in the Last Year: Never true  Housing:   . Last Housing Risk Score: Not on file  Physical Activity:   . Days of Exercise per Week: Not on file  . Minutes of Exercise per Session: Not on file  Social Connections:   . Frequency of Communication with Friends and Family: Not on file  . Frequency of Social Gatherings with Friends and Family: Not on file  . Attends Religious Services: Not on file  . Active Member of Clubs or Organizations: Not on file  . Attends Archivist Meetings: Not on file  . Marital Status: Not on file  Stress:   . Feeling of Stress : Not on file  Tobacco Use: Medium Risk  . Smoking Tobacco Use: Passive Smoke Exposure - Never Smoker  . Smokeless Tobacco Use: Never Used  Transportation Needs: No Transportation Needs  . Lack of Transportation (Medical): No  . Lack of Transportation (Non-Medical): No     Objective:    No Known Allergies  Medications:    Medications Reviewed Today    Reviewed by Gayla Medicus, RN (Registered Nurse) on 08/09/20 at 1354  Med List Status: <None>  Medication Order Taking? Sig Documenting  Provider Last Dose Status Informant  albuterol (PROVENTIL) (2.5 MG/3ML) 0.083% nebulizer solution [id number] Yes Take 3 mLs (2.5 mg total) by nebulization every 6 (six) hours as needed for wheezing. [name], MD Taking Active Self  albuterol (VENTOLIN HFA) 108 (90 Base) MCG/ACT inhaler [id number] Yes Inhale 1-2 puffs into the lungs every 6 (six) hours as needed for wheezing or shortness of breath. [name], MD Taking Active Self  prenatal vitamin w/FE, FA (PRENATAL 1 + 1) 27-1 MG TABS tablet [id number] Yes Take 1 tablet by mouth daily at 12 noon. Rasch, Artist Pais, NP Taking Active Self          Assessment:   Goals Addressed   None     Plan: RNCM will follow up with patient within next 30 days.  Patient without complaints today.  Mastitis improved and patient stated she did not need assistance with anything today.

## 2020-08-09 NOTE — Patient Instructions (Signed)
  Ms. Hereford,   Thank you for speaking with me today.  It was nice speaking with you and I am glad you are feeling better and your baby is doing well.    Ms. Allbaugh was given information about Medicaid Managed Care team care coordination services as a part of their Healthy [facility] Medicaid benefit. [name] verbally consented to engagement with the [facility] Managed Care team.   For questions related to your Healthy [facility] health plan, please call: [phone] or visit the homepage here: [email or web address]  If you would like to schedule transportation through your Healthy [facility] plan, please call the following number at least 2 days in advance of your appointment: [phone]   The Managed Medicaid care management team will reach out to the patient again over the next 30 days.   [name] RN, BSN [place]  [employer]ator - Managed Medicaid High Risk [phone]

## 2020-08-23 ENCOUNTER — Ambulatory Visit: Payer: Medicaid Other

## 2020-09-06 ENCOUNTER — Other Ambulatory Visit: Payer: Self-pay | Admitting: Obstetrics and Gynecology

## 2020-09-06 NOTE — Patient Outreach (Signed)
° °  Care Coordination - Case Manager  09/06/2020  Susan Moore Sep 10, 2001 [id number]  Subjective:  Susan Moore is an 19 y.o. year old female who is a primary patient of Vevelyn Francois, NP.  Ms. [name] was given information about Medicaid Managed Care team care coordination services today. [name] agreed to services and verbal consent obtained  Review of patient status, laboratory and other test data was performed as part of evaluation for provision of services.  SDOH: SDOH Screenings   Alcohol Screen:    Last Alcohol Screening Score (AUDIT): Not on file  Depression (PHQ2-9): Low Risk    PHQ-2 Score: 0  Financial Resource Strain:    Difficulty of Paying Living Expenses: Not on file  Food Insecurity: No Food Insecurity   Worried About Running Out of Food in the Last Year: Never true   Ran Out of Food in the Last Year: Never true  Housing:    Last Housing Risk Score: Not on file  Physical Activity:    Days of Exercise per Week: Not on file   Minutes of Exercise per Session: Not on file  Social Connections:    Frequency of Communication with Friends and Family: Not on file   Frequency of Social Gatherings with Friends and Family: Not on file   Attends Religious Services: Not on file   Active Member of Clubs or Organizations: Not on file   Attends Archivist Meetings: Not on file   Marital Status: Not on file  Stress:    Feeling of Stress : Not on file  Tobacco Use: Medium Risk   Smoking Tobacco Use: Passive Smoke Exposure - Never Smoker   Smokeless Tobacco Use: Never Used  Transportation Needs: No Data processing manager (Medical): No   Lack of Transportation (Non-Medical): No     Objective:    No Known Allergies  Medications:    Medications Reviewed Today    Reviewed by Gayla Medicus, RN (Registered Nurse) on 09/06/20 at [place] List Status: <None>  Medication Order Taking? Sig Documenting  Provider Last Dose Status Informant  albuterol (PROVENTIL) (2.5 MG/3ML) 0.083% nebulizer solution [id number] Yes Take 3 mLs (2.5 mg total) by nebulization every 6 (six) hours as needed for wheezing. [name], MD Taking Active Self  albuterol (VENTOLIN HFA) 108 (90 Base) MCG/ACT inhaler [id number] Yes Inhale 1-2 puffs into the lungs every 6 (six) hours as needed for wheezing or shortness of breath. [name], MD Taking Active Self  prenatal vitamin w/FE, FA (PRENATAL 1 + 1) 27-1 MG TABS tablet [id number] Yes Take 1 tablet by mouth daily at 12 noon. Rasch, Artist Pais, NP Taking Active Self          Plan: RNCM followed up with patient today.  She has no complaints.  Will close for now as patient is improved and has no needs.  Patient aware she can call as needed.    [name] RN, BSN [place]   [facility]ator - Managed Medicaid High Risk [phone]

## 2020-09-06 NOTE — Patient Instructions (Signed)
Hi [name] you for speaking with me today.  [name] was given information about Medicaid Managed Care team care coordination services as a part of their Healthy [facility] Medicaid benefit. [name] verbally consented to engagement with the [facility] Managed Care team.   For questions related to your Healthy [facility] health plan, please call: [phone] or visit the homepage here: [email or web address]  If you would like to schedule transportation through your Healthy [facility] plan, please call the following number at least 2 days in advance of your appointment: [phone]   The patient has been provided with contact information for the Managed Medicaid care management team and has been advised to call with any health related questions or concerns.   [name] RN, BSN [place]  [employer]ator - Managed Medicaid High Risk [phone]

## 2020-11-15 DIAGNOSIS — Z113 Encounter for screening for infections with a predominantly sexual mode of transmission: Secondary | ICD-10-CM | POA: Diagnosis not present

## 2020-11-20 ENCOUNTER — Ambulatory Visit: Payer: Medicaid Other | Admitting: Nurse Practitioner

## 2020-11-20 ENCOUNTER — Other Ambulatory Visit: Payer: Self-pay

## 2020-11-20 DIAGNOSIS — J4520 Mild intermittent asthma, uncomplicated: Secondary | ICD-10-CM

## 2020-11-20 MED ORDER — ALBUTEROL SULFATE HFA 108 (90 BASE) MCG/ACT IN AERS: 1.0000 | INHALATION_SPRAY | Freq: Four times a day (QID) | RESPIRATORY_TRACT | 1 refills | Status: DC | PRN

## 2020-12-28 IMAGING — CT CT ANGIO CHEST
2 of 6 series · 18 of 46 positions shown · IV contrast (omnipaque)
Comparison: None.

CLINICAL DATA: Chest pain, pleurisy

EXAM:
CT ANGIOGRAPHY CHEST WITH CONTRAST
TECHNIQUE: Multidetector CT imaging of the chest was performed using the
standard protocol during bolus administration of intravenous
contrast. Multiplanar CT image reconstructions and MIPs were
obtained to evaluate the vascular anatomy.
CONTRAST:  100mL OMNIPAQUE IOHEXOL 350 MG/ML SOLN

[Series 8: thins · axial · 0.78mm/px · z∈[+1033,+1270]mm · 15 of 261 slices shown]
[im 12/261  lung]
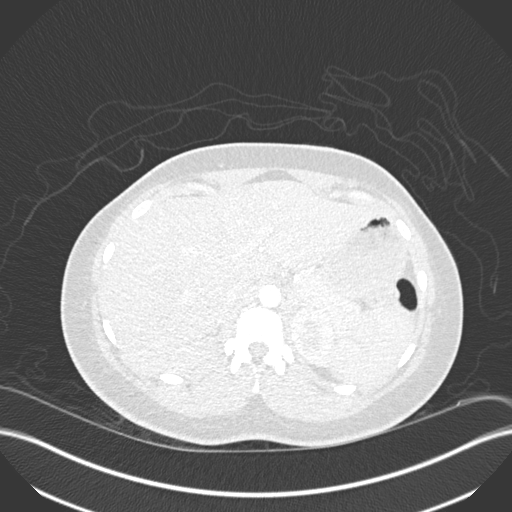
[im 34/261  soft-tissue]
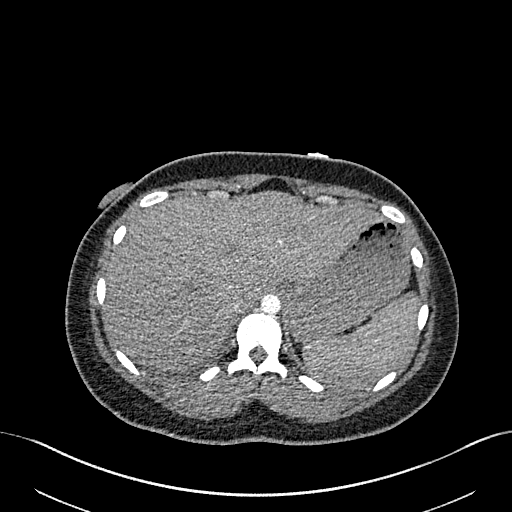
[im 46/261  lung]
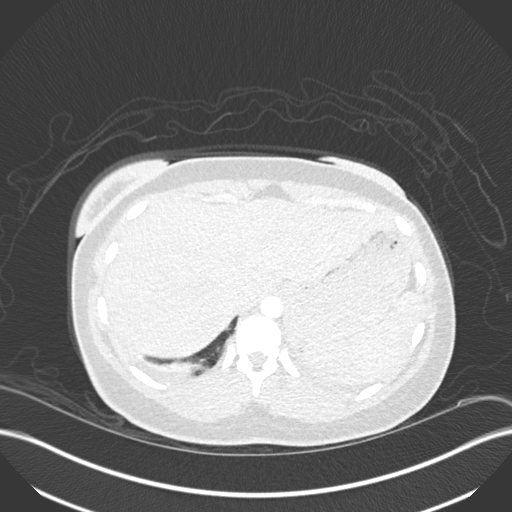
[im 68/261  soft-tissue]
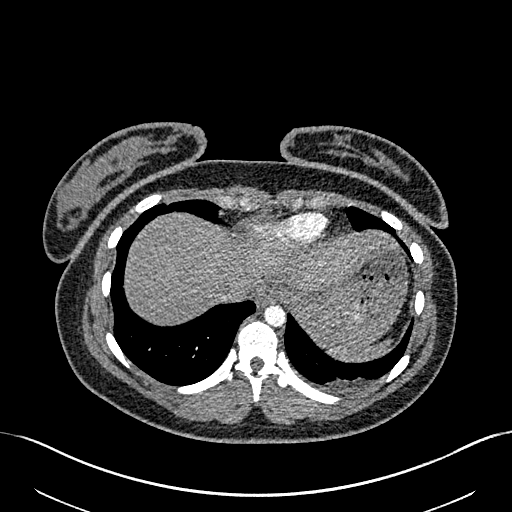
[im 80/261  lung]
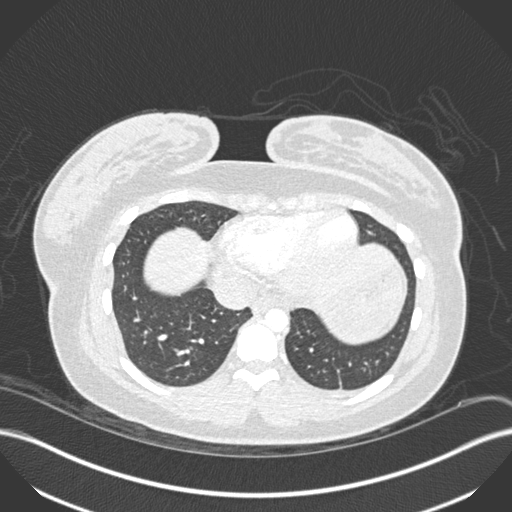
[im 102/261  soft-tissue]
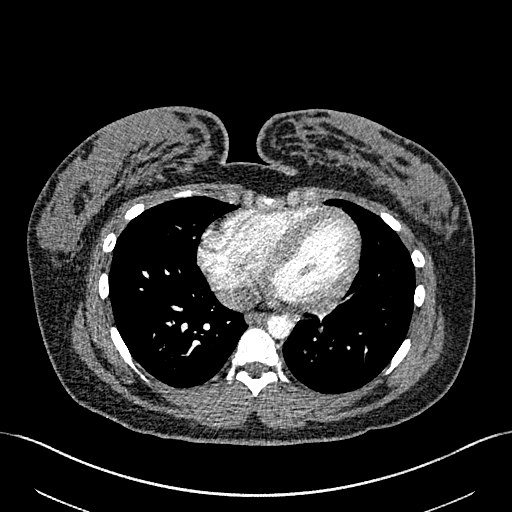
[im 114/261  lung]
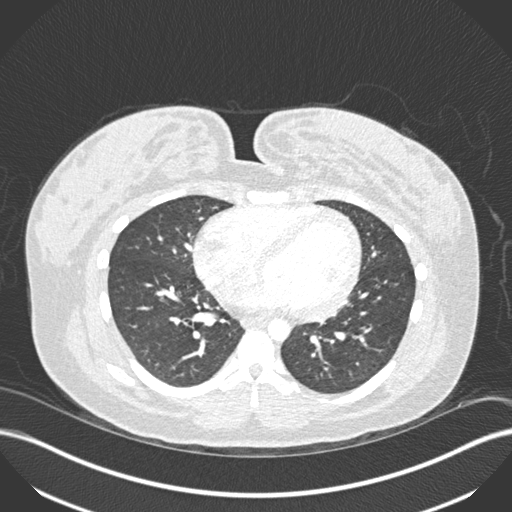
[im 136/261  soft-tissue]
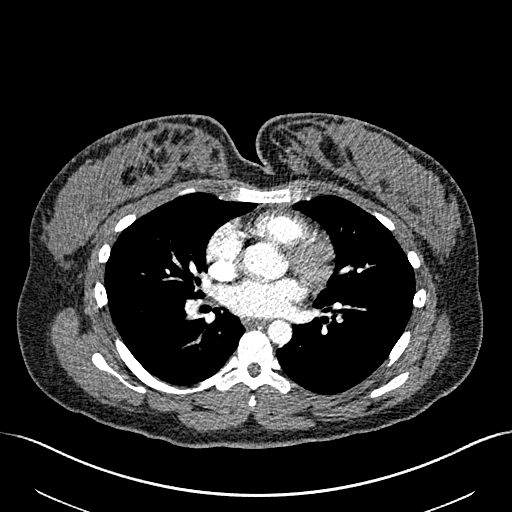
[im 147/261  lung]
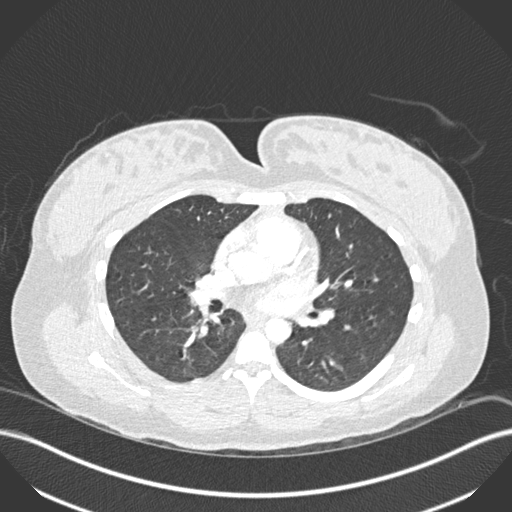
[im 159/261  soft-tissue]
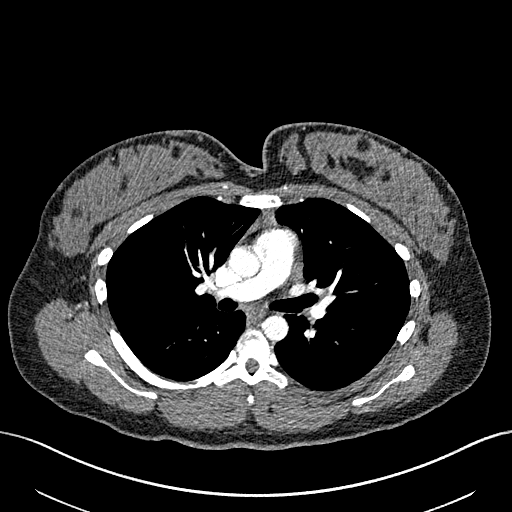
[im 181/261  lung]
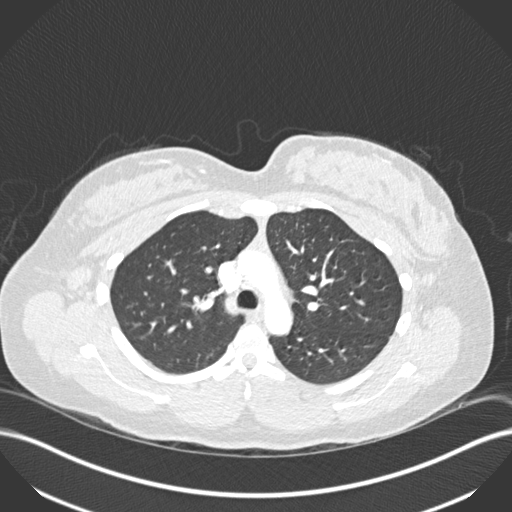
[im 193/261  soft-tissue]
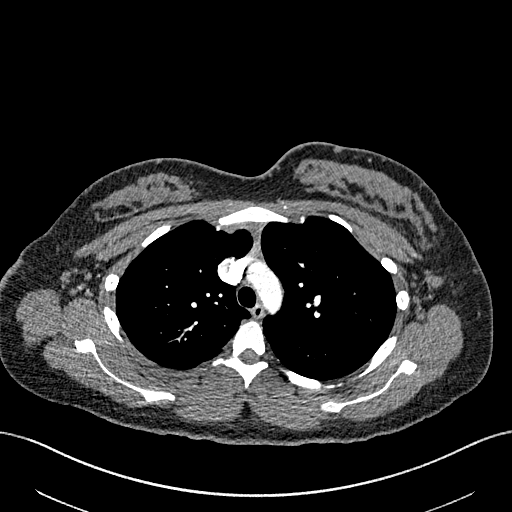
[im 215/261  lung]
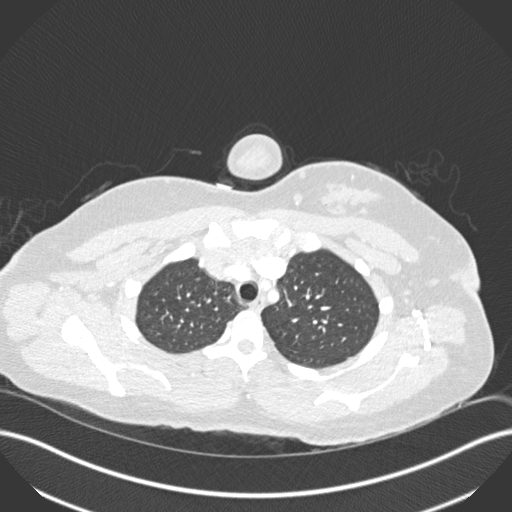
[im 227/261  soft-tissue]
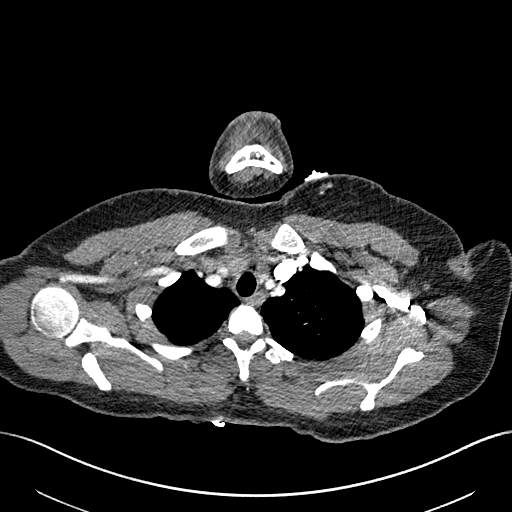
[im 249/261  lung]
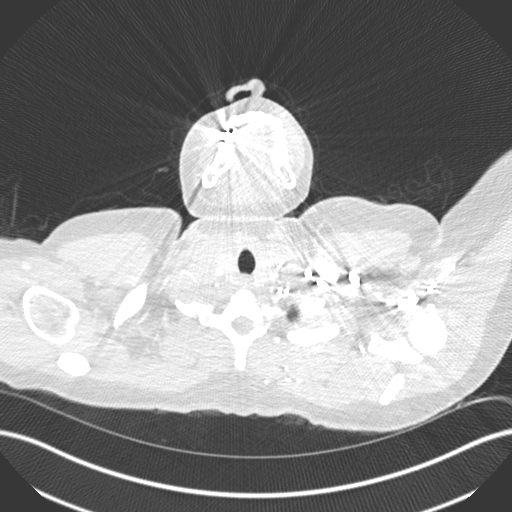

[Series 10: coronal mpr · coronal · 0.52mm/px · 3 of 119 slices shown]
[im 30/119  soft-tissue]
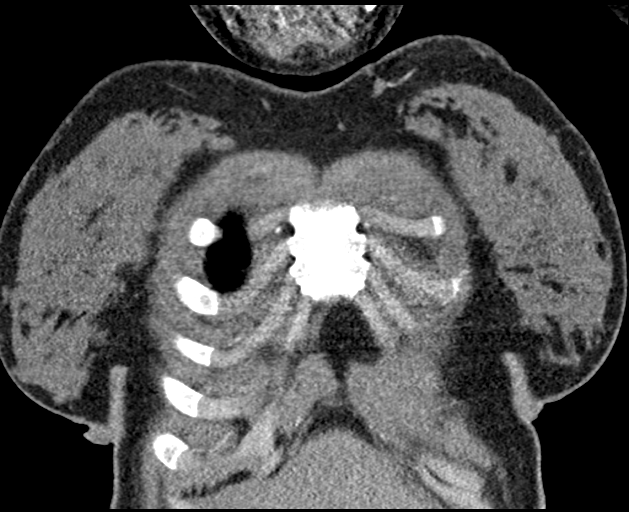
[im 60/119  soft-tissue]
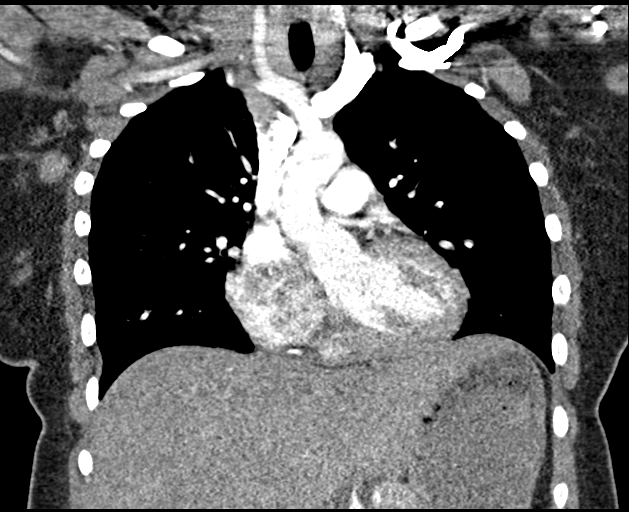
[im 89/119  soft-tissue]
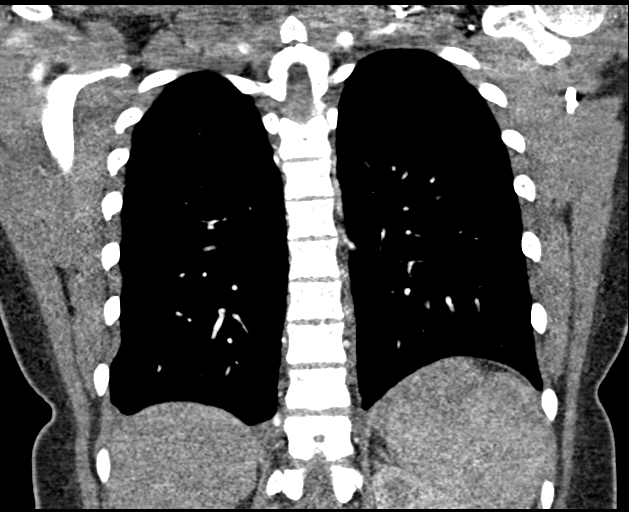

[18 of 46 positions shown; findings below may reference images not displayed]

FINDINGS: Cardiovascular: Satisfactory opacification of the pulmonary arteries
to the segmental level. No evidence of pulmonary embolism. Normal
heart size. No pericardial effusion. Thoracic aorta is unremarkable.

Mediastinum/Nodes: Shotty bilateral axillary adenopathy is noted,
nonspecific. No frankly pathologic thoracic adenopathy is present.
Esophagus unremarkable. Visualized thyroid is unremarkable.

Lungs/Pleura: Small left pleural effusion is present with minimal
left basilar compressive atelectasis. The lungs are otherwise clear.
No pneumothorax. Central airways are widely patent.

Upper Abdomen: No acute abnormality.

Musculoskeletal: No chest wall abnormality. No acute or significant
osseous findings.

Review of the MIP images confirms the above findings.
IMPRESSION: No pulmonary embolism.

Small left pleural effusion. No definite radiographic explanation
for the patient's reported symptoms.

## 2021-01-24 ENCOUNTER — Other Ambulatory Visit: Payer: Self-pay

## 2021-01-24 ENCOUNTER — Encounter: Payer: Self-pay | Admitting: Nurse Practitioner

## 2021-01-24 ENCOUNTER — Ambulatory Visit: Payer: Medicaid Other | Admitting: Nurse Practitioner

## 2021-01-24 VITALS — BP 122/71 | HR 61 | Temp 97.6°F | Ht 63.0 in | Wt 173.0 lb

## 2021-01-24 DIAGNOSIS — Z113 Encounter for screening for infections with a predominantly sexual mode of transmission: Secondary | ICD-10-CM

## 2021-01-24 DIAGNOSIS — Z23 Encounter for immunization: Secondary | ICD-10-CM

## 2021-01-24 DIAGNOSIS — E876 Hypokalemia: Secondary | ICD-10-CM | POA: Diagnosis not present

## 2021-01-24 DIAGNOSIS — Z87448 Personal history of other diseases of urinary system: Secondary | ICD-10-CM | POA: Diagnosis not present

## 2021-01-24 DIAGNOSIS — N898 Other specified noninflammatory disorders of vagina: Secondary | ICD-10-CM | POA: Diagnosis not present

## 2021-01-24 DIAGNOSIS — Z130 Encounter for screening for diseases of the blood and blood-forming organs and certain disorders involving the immune mechanism: Secondary | ICD-10-CM | POA: Diagnosis not present

## 2021-01-24 LAB — POCT URINALYSIS DIPSTICK
Bilirubin, UA: NEGATIVE
Glucose, UA: NEGATIVE
Ketones, UA: POSITIVE
Leukocytes, UA: NEGATIVE
Nitrite, UA: NEGATIVE
Protein, UA: NEGATIVE
Spec Grav, UA: 1.025 (ref 1.010–1.025)
Urobilinogen, UA: 0.2 E.U./dL
pH, UA: 6.5 (ref 5.0–8.0)

## 2021-01-24 MED ORDER — METRONIDAZOLE 500 MG PO TABS: 500.0000 mg | ORAL_TABLET | Freq: Three times a day (TID) | ORAL | 0 refills | Status: DC

## 2021-01-24 NOTE — Progress Notes (Signed)
[place], [place]  [id number] Phone:  [phone]   Fax:  [phone]    Acute Office Visit  Subjective:    Patient ID: Susan Moore, female    DOB: [birth year], 20 y.o.   MRN: [id number]  Chief Complaint  Patient presents with  . Vaginal Discharge    Pt notice that she having vaginal discharge about 3 day , with odor ,  pt was to test for std     HPI Patient is in today for vaginal symptoms. 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   Vaginitis Patient presents for evaluation of an abnormal vaginal discharge. Symptoms have been present for 3 days. Vaginal symptoms: discharge described as creamy and thick and odor. Contraception: condoms. She denies abnormal bleeding, blisters, bumps, burning, dyspareunia, lesions, local irritation, pain, post coital bleeding, tears, vulvar itching and warts. Sexually transmitted infection risk: very low risk of STD exposure. Menstrual flow: regular every 28-30 days.  Past Medical History:  Diagnosis Date  . Asthma   . Chlamydia   . GDM (gestational diabetes mellitus) 03/2020  . 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 Gestational diabetes mellitus (GDM) in third trimester controlled on oral hypoglycemic drug 05/18/2020    Past Surgical History:  Procedure Laterality Date  . CESAREAN SECTION  05/19/2020   Procedure: CESAREAN SECTION;  Surgeon: Donnamae Jude, MD;  Location: [facility];  Service: Obstetrics;;  Arrest of Dilation, Fetal Tachycardia  . WISDOM TOOTH EXTRACTION      No family history on file.  Social History   Socioeconomic History  . Marital  status: Single    Spouse name: Not on file  . Number of children: Not on file  . Years of education: Not on file  . Highest education level: Not on file  Occupational History  . Not on file  Tobacco Use  . Smoking status: Passive Smoke Exposure - Never Smoker  . Smokeless tobacco: Never Used  Vaping Use  . Vaping Use: Never used  Substance and Sexual Activity  . Alcohol use: No  . Drug use: Not Currently    Types: Marijuana    Comment: last used when found out pregnant  . Sexual activity: Not Currently    Birth control/protection: None  Other Topics Concern  . Not on file  Social History Narrative  . Not on file   Social Determinants of Health   Financial Resource Strain: Not on file  Food Insecurity: No Food Insecurity  . Worried About Charity fundraiser in the Last Year: Never true  . Ran Out of Food in the Last Year: Never true  Transportation Needs: No Transportation Needs  . Lack of Transportation (Medical): No  . Lack of Transportation (Non-Medical): No  Physical Activity: Not on file  Stress: Not on file  Social Connections: Not on file  Intimate Partner Violence: Not on file    Outpatient Medications Prior to Visit  Medication Sig Dispense Refill  . albuterol (PROVENTIL) (2.5 MG/3ML) 0.083% nebulizer solution Take 3 mLs (2.5 mg total) by nebulization every 6 (six) hours as needed for wheezing. 75 mL 1  . albuterol (VENTOLIN HFA)  108 (90 Base) MCG/ACT inhaler Inhale 1-2 puffs into the lungs every 6 (six) hours as needed for wheezing or shortness of breath. 6.7 g 1  . prenatal vitamin w/FE, FA (PRENATAL 1 + 1) 27-1 MG TABS tablet Take 1 tablet by mouth daily at 12 noon. 30 tablet 11   No facility-administered medications prior to visit.    No Known Allergies  Review of Systems  All other systems reviewed and are negative.      Objective:    Physical Exam Constitutional:      Appearance: She is obese.  HENT:     Head: Normocephalic and atraumatic.   Cardiovascular:     Rate and Rhythm: Normal rate and regular rhythm.     Pulses: Normal pulses.     Heart sounds: Normal heart sounds.  Pulmonary:     Effort: Pulmonary effort is normal.     Breath sounds: Normal breath sounds.  Musculoskeletal:     Cervical back: Normal range of motion.  Skin:    General: Skin is warm.     Capillary Refill: Capillary refill takes less than 2 seconds.  Neurological:     General: No focal deficit present.     Mental Status: She is alert and oriented to person, place, and time.  Psychiatric:        Mood and Affect: Mood normal.        Behavior: Behavior normal.        Thought Content: Thought content normal.        Judgment: Judgment normal.     BP 122/71   Pulse 61   Temp 97.6 F (36.4 C) (Temporal)   Ht 5\' 3"  (1.6 m)   Wt 173 lb (78.5 kg)   SpO2 97%   BMI 30.65 kg/m  Wt Readings from Last 3 Encounters:  01/24/21 173 lb (78.5 kg)  08/03/20 184 lb 12.8 oz (83.8 kg)  07/12/20 181 lb 0.1 oz (82.1 kg)    Health Maintenance Due  Topic Date Due  . HPV VACCINES (1 - 2-dose series) Never done       Topic Date Due  . HPV VACCINES (1 - 2-dose series) Never done     No results found for: TSH Lab Results  Component Value Date   WBC 11.4 (H) 07/09/2020   HGB 11.9 (L) 07/09/2020   HCT 37.1 07/09/2020   MCV 85.7 07/09/2020   PLT 318 07/09/2020   Lab Results  Component Value Date   NA 136 07/09/2020   K 3.0 (L) 07/09/2020   CO2 21 (L) 07/09/2020   GLUCOSE 141 (H) 07/09/2020   BUN 13 07/09/2020   CREATININE 0.97 07/09/2020   BILITOT 0.8 07/09/2020   ALKPHOS 129 (H) 07/09/2020   AST 23 07/09/2020   ALT 34 07/09/2020   PROT 7.8 07/09/2020   ALBUMIN 3.6 07/09/2020   CALCIUM 8.8 (L) 07/09/2020   ANIONGAP 12 07/09/2020   No results found for: CHOL No results found for: HDL No results found for: LDLCALC No results found for: TRIG No results found for: CHOLHDL Lab Results  Component Value Date   HGBA1C 5.2 10/27/2019        Assessment & Plan:   Problem List Items Addressed This Visit   None   Visit Diagnoses    Vaginal discharge    -  Primary Metronidazole 500 mg #21 3 times daily 7 days Education information STD screening pending   Relevant Orders   Bacterial Vaginosis, NAA  POCT Urinalysis Dipstick (Completed)   History of blood in urine       Relevant Orders   POCT Urinalysis Dipstick (Completed)   Screening for deficiency anemia Reevaluation with labs pending       Relevant Orders   CBC with Differential/Platelet   Hypokalemia     Previous potassium 3.0 will revaluate.    Relevant Orders   Comp. Metabolic Panel (12)       No orders of the defined types were placed in this encounter.    Vevelyn Francois, NP

## 2021-01-24 NOTE — Patient Instructions (Addendum)
Healthy Eating Following a healthy eating pattern may help you to achieve and maintain a healthy body weight, reduce the risk of chronic disease, and live a long and productive life. It is important to follow a healthy eating pattern at an appropriate calorie level for your body. Your nutritional needs should be met primarily through food by choosing a variety of nutrient-rich foods. What are tips for following this plan? Reading food labels  Read labels and choose the following: ? Reduced or low sodium. ? Juices with 100% fruit juice. ? Foods with low saturated fats and high polyunsaturated and monounsaturated fats. ? Foods with whole grains, such as whole wheat, cracked wheat, brown rice, and wild rice. ? Whole grains that are fortified with folic acid. This is recommended for women who are pregnant or who want to become pregnant.  Read labels and avoid the following: ? Foods with a lot of added sugars. These include foods that contain brown sugar, corn sweetener, corn syrup, dextrose, fructose, glucose, high-fructose corn syrup, honey, invert sugar, lactose, malt syrup, maltose, molasses, raw sugar, sucrose, trehalose, or turbinado sugar.  Do not eat more than the following amounts of added sugar per day:  6 teaspoons (25 g) for women.  9 teaspoons (38 g) for men. ? Foods that contain processed or refined starches and grains. ? Refined grain products, such as white flour, degermed cornmeal, white bread, and white rice. Shopping  Choose nutrient-rich snacks, such as vegetables, whole fruits, and nuts. Avoid high-calorie and high-sugar snacks, such as potato chips, fruit snacks, and candy.  Use oil-based dressings and spreads on foods instead of solid fats such as butter, stick margarine, or cream cheese.  Limit pre-made sauces, mixes, and "instant" products such as flavored rice, instant noodles, and ready-made pasta.  Try more plant-protein sources, such as tofu, tempeh, black beans,  edamame, lentils, nuts, and seeds.  Explore eating plans such as the Mediterranean diet or vegetarian diet. Cooking  Use oil to saut or stir-fry foods instead of solid fats such as butter, stick margarine, or lard.  Try baking, boiling, grilling, or broiling instead of frying.  Remove the fatty part of meats before cooking.  Steam vegetables in water or broth. Meal planning  At meals, imagine dividing your plate into fourths: ? One-half of your plate is fruits and vegetables. ? One-fourth of your plate is whole grains. ? One-fourth of your plate is protein, especially lean meats, poultry, eggs, tofu, beans, or nuts.  Include low-fat dairy as part of your daily diet.   Lifestyle  Choose healthy options in all settings, including home, work, school, restaurants, or stores.  Prepare your food safely: ? Wash your hands after handling raw meats. ? Keep food preparation surfaces clean by regularly washing with hot, soapy water. ? Keep raw meats separate from ready-to-eat foods, such as fruits and vegetables. ? Cook seafood, meat, poultry, and eggs to the recommended internal temperature. ? Store foods at safe temperatures. In general:  Keep cold foods at 7F (4.4C) or below.  Keep hot foods at 17F (60C) or above.  Keep your freezer at [facility] (-17.8C) or below.  Foods are no longer safe to eat when they have been between the temperatures of 40-17F (4.4-60C) for more than 2 hours. What foods should I eat? Fruits Aim to eat 2 cup-equivalents of fresh, canned (in natural juice), or frozen fruits each day. Examples of 1 cup-equivalent of fruit include 1 small apple, 8 large strawberries, 1 cup canned fruit,  cup dried fruit, or 1 cup 100% juice. Vegetables Aim to eat 2-3 cup-equivalents of fresh and frozen vegetables each day, including different varieties and colors. Examples of 1 cup-equivalent of vegetables include 2 medium carrots, 2 cups raw, leafy greens, 1 cup chopped  vegetable (raw or cooked), or 1 medium baked potato. Grains Aim to eat 6 ounce-equivalents of whole grains each day. Examples of 1 ounce-equivalent of grains include 1 slice of bread, 1 cup ready-to-eat cereal, 3 cups popcorn, or  cup cooked rice, pasta, or cereal. Meats and other proteins Aim to eat 5-6 ounce-equivalents of protein each day. Examples of 1 ounce-equivalent of protein include 1 egg, 1/2 cup nuts or seeds, or 1 tablespoon (16 g) peanut butter. A cut of meat or fish that is the size of a deck of cards is about 3-4 ounce-equivalents.  Of the protein you eat each week, try to have at least 8 ounces come from seafood. This includes salmon, trout, herring, and anchovies. Dairy Aim to eat 3 cup-equivalents of fat-free or low-fat dairy each day. Examples of 1 cup-equivalent of dairy include 1 cup (240 mL) milk, 8 ounces (250 g) yogurt, 1 ounces (44 g) natural cheese, or 1 cup (240 mL) fortified soy milk. Fats and oils  Aim for about 5 teaspoons (21 g) per day. Choose monounsaturated fats, such as canola and olive oils, avocados, peanut butter, and most nuts, or polyunsaturated fats, such as sunflower, corn, and soybean oils, walnuts, pine nuts, sesame seeds, sunflower seeds, and flaxseed. Beverages  Aim for six 8-oz glasses of water per day. Limit coffee to three to five 8-oz cups per day.  Limit caffeinated beverages that have added calories, such as soda and energy drinks.  Limit alcohol intake to no more than 1 drink a day for nonpregnant women and 2 drinks a day for men. One drink equals 12 oz of beer (355 mL), 5 oz of wine (148 mL), or 1 oz of hard liquor (44 mL). Seasoning and other foods  Avoid adding excess amounts of salt to your foods. Try flavoring foods with herbs and spices instead of salt.  Avoid adding sugar to foods.  Try using oil-based dressings, sauces, and spreads instead of solid fats. This information is based on general [place] nutrition guidelines. For more  information, visit Build[employer].es. Exact amounts may vary based on your nutrition needs. Summary  A healthy eating plan may help you to maintain a healthy weight, reduce the risk of chronic diseases, and stay active throughout your life.  Plan your meals. Make sure you eat the right portions of a variety of nutrient-rich foods.  Try baking, boiling, grilling, or broiling instead of frying.  Choose healthy options in all settings, including home, work, school, restaurants, or stores. This information is not intended to replace advice given to you by your health care provider. Make sure you discuss any questions you have with your health care provider. Document Revised: 02/09/2018 Document Reviewed: 02/09/2018 Elsevier Patient Education  2021 [place] for Parents  HPV (human papillomavirus) is a common virus that spreads easily from person to person through skin-to-skin or sexual contact. There are many types of HPV viruses. They can cause warts in the genitals (genital or mucosal HPV), or on the hands or feet (cutaneous or nonmucosal HPV). Some genital HPV types are considered high-risk and may cause cancer. Your child can get a vaccination to help prevent certain HPV infections that can cause cancer as well as  those types that cause genital and anal warts. The vaccine is safe and effective. It is recommended for boys and girls at about 50-34 years of age. Getting the vaccine at this age (before he or she is sexually active) gives your child the best protection from HPV infection through adulthood. How can HPV affect my child? HPV infection can cause:  Genital warts.  Mouth or throat cancer.  Anal cancer.  Cervical, vulvar, or vaginal cancer.  Penile cancer. During pregnancy, HPV infection can be passed to the baby. This infection can cause warts to develop in a baby's throat and windpipe. What actions can I take to lower my child's risk for HPV? To  lower your child's risk for genital HPV infection, have him or her get the HPV vaccine before becoming sexually active. The best time for vaccination is between ages 47 and 66, though it can be given to children as young as 56 years old. If your child gets the first dose before age 59, the vaccination can be given as 2 shots, 6-12 months apart. In some situations, 3 doses are needed.  If your child starts the vaccine before age 23 but does not have a second dose within 6-12 months after the first dose, he or she will need 3 doses to complete the vaccination. When your child has the first dose, it is important to make an appointment for the next shot and keep the appointment.  Teens who are not vaccinated before age 42 will need 3 doses, within six months of the first dose.  If your child has a weak immune system, he or she may need 3 doses. Young adults can also get the vaccination, even if they are already sexually active and even if they have already been infected with HPV. The vaccination can still help prevent the types of cancer-causing HPV that a person has not been infected with. What are the risks and benefits of the HPV vaccine? Benefits The main benefit of getting vaccinated is to prevent certain cancers, including:  Cervical, vulvar, and vaginal cancer in females.  Penile cancer in males.  Oral and anal cancer in both males and females. The risk of these cancers is lower if your child gets vaccinated before he or she becomes sexually active. The vaccine also prevents genital warts caused by HPV. Risks The risks, although low, include side effects or reactions to the vaccine. Very few reactions have been reported, but they can include:  Soreness, redness, or swelling at the injection site.  Dizziness or headache.  Fever. Who should not get the HPV vaccine or should wait to get it? Some children should not get the HPV vaccine or should wait. Discuss the risks and benefits of the  vaccine with your child's health care provider if your child:  Has had a severe allergic reaction to other vaccinations.  Is allergic to yeast.  Has a fever.  Has had a recent illness.  Is pregnant or may be pregnant. Where to find more information  Centers for Disease Control and Prevention: https://www.mckee-gibson.com/  American Academy of Pediatrics: healthychildren.org Summary  HPV (human papillomavirus) is a common virus that spreads from person to person through skin-to-skin or sexual contact. It can spread during vaginal, anal, or oral sex.  Your child can get a vaccination to prevent HPV infection and cancer. It is best to get the vaccination before becoming sexually active.  The HPV vaccine can protect your child from genital warts and certain types of cancer,  including cancer of the cervix, throat, mouth, vulva, vagina, anus, and penis.  The HPV vaccine is both safe and effective.  The best time for boys and girls to get the vaccination is when they are between ages 56 and 76. This information is not intended to replace advice given to you by your health care provider. Make sure you discuss any questions you have with your health care provider. Document Revised: 07/04/2020 Document Reviewed: 06/13/2020 Elsevier Patient Education  2021 Elsevier Inc.  Vaginitis  Vaginitis is a condition in which the vaginal tissue swells and becomes irritated. This condition is most often caused by a change in the normal balance of bacteria and yeast that live in the vagina. This change causes an overgrowth of certain bacteria or yeast, which causes the inflammation. There are different types of vaginitis. What are the causes? The cause of this condition depends on the type of vaginitis. It can be caused by:  Bacteria (bacterial vaginosis).  Yeast, which is a fungus (candidiasis).  A parasite (trichomoniasis vaginitis).  A virus (viral vaginitis).  Low hormone levels (atrophic vaginitis). Low  hormone levels can occur during pregnancy, breastfeeding, or after menopause.  Irritants, such as bubble baths, scented tampons, and feminine sprays (allergic vaginitis). Other factors can change the normal balance of the yeast and bacteria that live in the vagina. These include:  Antibiotic medicines.  Poor hygiene.  Diaphragms, vaginal sponges, spermicides, birth control pills, and intrauterine devices (IUDs).  Sex.  Infection.  Uncontrolled diabetes.  A weakened body defense system (immune system). What increases the risk? This condition is more likely to develop in women who:  Smoke or are exposed to secondhand smoke.  Use vaginal douches, scented tampons, or scented sanitary pads.  Wear tight-fitting pants or thong underwear.  Use oral birth control pills or an IUD.  Have sex without a condom or have multiple partners.  Have an STI.  Frequently use the spermicide nonoxynol-9.  Eat lots of foods high in sugar or who have uncontrolled diabetes.  Have low estrogen levels.  Have a weakened immune system from an immune disorder or medical treatment.  Are pregnant or breastfeeding. What are the signs or symptoms? Symptoms vary depending on the cause of the vaginitis. Common symptoms include:  Abnormal vaginal discharge. ? The discharge is white, gray, or yellow with bacterial vaginosis. ? The discharge is thick, white, and cheesy with a yeast infection. ? The discharge is frothy and yellow or greenish with trichomoniasis.  A bad vaginal smell. The smell is fishy with bacterial vaginosis.  Vaginal itching, pain, or swelling.  Pain with sex.  Pain or burning when urinating. Sometimes there are no symptoms. How is this diagnosed? This condition is diagnosed based on your symptoms and medical history. A physical exam, including a pelvic exam, will also be done. You may also have other tests, including:  Tests to determine the pH level (acidity or alkalinity) of  your vagina.  A whiff test to assess the odor that results when a sample of your vaginal discharge is mixed with a potassium hydroxide solution.  Tests of vaginal fluid. A sample will be examined under a microscope. How is this treated? Treatment varies depending on the type of vaginitis you have. Your treatment may include:  Antibiotic creams or pills to treat bacterial vaginosis and trichomoniasis.  Antifungal medicines, such as vaginal creams or suppositories, to treat a yeast infection.  Medicine to ease discomfort if you have viral vaginitis. Your sexual partner should  also be treated.  Estrogen delivered in a cream, pill, suppository, or vaginal ring to treat atrophic vaginitis. If vaginal dryness occurs, lubricants and moisturizing creams may help. You may need to avoid scented soaps, sprays, or douches.  Stopping use of a product that is causing allergic vaginitis and then using a vaginal cream to treat the symptoms. Follow these instructions at home: Lifestyle  Keep your genital area clean and dry. Avoid soap, and only rinse the area with water.  Do not douche or use tampons until your health care provider says it is okay. Use sanitary pads, if needed.  Do not have sex until your health care provider approves. When you can return to sex, practice safe sex and use condoms.  Wipe from front to back. This avoids the spread of bacteria from the rectum to the vagina. General instructions  Take over-the-counter and prescription medicines only as told by your health care provider.  If you were prescribed an antibiotic medicine, take or use it as told by your health care provider. Do not stop taking or using the antibiotic even if you start to feel better.  Keep all follow-up visits. This is important. How is this prevented?  Use mild, unscented products. Do not use things that can irritate the vagina, such as fabric softeners. Avoid the following products if they are  scented: ? Feminine sprays. ? Detergents. ? Tampons. ? Feminine hygiene products. ? Soaps or bubble baths.  Let air reach your genital area. To do this: ? Wear cotton underwear to reduce moisture buildup. ? Avoid wearing underwear while you sleep. ? Avoid wearing tight pants and underwear or nylons without a cotton panel. ? Avoid wearing thong underwear.  Take off any wet clothing, such as bathing suits, as soon as possible.  Practice safe sex and use condoms. Contact a health care provider if:  You have abdominal or pelvic pain.  You have a fever or chills.  You have symptoms that last for more than 2-3 days. Get help right away if:  You have a fever and your symptoms suddenly get worse. Summary  Vaginitis is a condition in which the vaginal tissue becomes inflamed.This condition is most often caused by a change in the normal balance of bacteria and yeast that live in the vagina.  Treatment varies depending on the type of vaginitis you have.  Do not douche, use tampons, or have sex until your health care provider approves. When you can return to sex, practice safe sex and use condoms. This information is not intended to replace advice given to you by your health care provider. Make sure you discuss any questions you have with your health care provider. Document Revised: 04/27/2020 Document Reviewed: 04/27/2020 Elsevier Patient Education  [place].

## 2021-01-28 LAB — BACTERIAL VAGINOSIS, NAA
Atopobium vaginae: HIGH Score — AB
BVAB 2: HIGH Score — AB
Megasphaera 1: HIGH Score — AB

## 2021-01-31 ENCOUNTER — Ambulatory Visit: Payer: Medicaid Other | Admitting: Nurse Practitioner

## 2021-02-01 ENCOUNTER — Other Ambulatory Visit: Payer: Self-pay

## 2021-02-01 DIAGNOSIS — J4520 Mild intermittent asthma, uncomplicated: Secondary | ICD-10-CM

## 2021-02-01 MED ORDER — ALBUTEROL SULFATE HFA 108 (90 BASE) MCG/ACT IN AERS: 1.0000 | INHALATION_SPRAY | Freq: Four times a day (QID) | RESPIRATORY_TRACT | 1 refills | Status: DC | PRN

## 2021-03-01 ENCOUNTER — Other Ambulatory Visit: Payer: Self-pay | Admitting: Nurse Practitioner

## 2021-03-01 DIAGNOSIS — N760 Acute vaginitis: Secondary | ICD-10-CM

## 2021-03-01 MED ORDER — METRONIDAZOLE 500 MG PO TABS: 500.0000 mg | ORAL_TABLET | Freq: Three times a day (TID) | ORAL | 0 refills | Status: DC

## 2021-03-01 NOTE — Progress Notes (Signed)
   [place], [place]  [address] Phone:  [phone]   Fax:  [phone]  Recurrent symptoms within one month after treatment Assessment  Primary Diagnosis & Pertinent Problem List: The encounter diagnosis was BV (bacterial vaginosis).  Visit Diagnosis: 1. BV (bacterial vaginosis)    One time refill.

## 2021-03-16 ENCOUNTER — Ambulatory Visit: Payer: Medicaid Other | Admitting: Nurse Practitioner

## 2021-04-19 ENCOUNTER — Encounter: Payer: Self-pay | Admitting: Nurse Practitioner

## 2021-04-19 ENCOUNTER — Other Ambulatory Visit: Payer: Self-pay

## 2021-04-19 ENCOUNTER — Telehealth (INDEPENDENT_AMBULATORY_CARE_PROVIDER_SITE_OTHER): Payer: Medicaid Other | Admitting: Nurse Practitioner

## 2021-04-19 DIAGNOSIS — Z308 Encounter for other contraceptive management: Secondary | ICD-10-CM | POA: Diagnosis not present

## 2021-04-19 NOTE — Patient Instructions (Signed)

## 2021-04-19 NOTE — Progress Notes (Signed)
   [place], [place]  [address] Phone:  [phone]   Fax:  [phone] Virtual Visit via Video Note  I connected with Susan Moore on 04/19/21 at  1:40 PM EDT by video and verified that I am speaking with the correct person using two identifiers.   I discussed the limitations, risks, security and privacy concerns of performing an evaluation and management service by tvideo and the availability of in person appointments. I also discussed with the patient that there may be a patient responsible charge related to this service. The patient expressed understanding and agreed to proceed.  Patient home Provider home  History of Present Illness:  Contraception Counseling Patient presents for contraception counseling. The patient has no complaints today.  Pertinent past medical history: none. She has tried Nexplanon in the past. She would like to start the patch.   Observations/Objective: Virtual visit  Assessment and Plan:  Assessment  Primary Diagnosis & Pertinent Problem List: The encounter diagnosis was Encounter for other contraceptive management.  Visit Diagnosis: 1. Encounter for other contraceptive management  UA pregnancy test to be completed on 04/20/21 Then will start OrthEvra     Follow Up Instructions: 1 year and as needed   I discussed the assessment and treatment plan with the patient. The patient was provided an opportunity to ask questions and all were answered. The patient agreed with the plan and demonstrated an understanding of the instructions.   The patient was advised to call back or seek an in-person evaluation if the symptoms worsen or if the condition fails to improve as anticipated.  I provided 10 minutes of video- visit time during this encounter.   Vevelyn Francois, NP

## 2021-06-04 ENCOUNTER — Other Ambulatory Visit: Payer: Self-pay

## 2021-06-04 ENCOUNTER — Ambulatory Visit (INDEPENDENT_AMBULATORY_CARE_PROVIDER_SITE_OTHER): Payer: Medicaid Other | Admitting: Nurse Practitioner

## 2021-06-04 DIAGNOSIS — Z308 Encounter for other contraceptive management: Secondary | ICD-10-CM

## 2021-06-04 LAB — POCT URINE PREGNANCY: Preg Test, Ur: NEGATIVE

## 2021-06-04 NOTE — Progress Notes (Signed)
Patient came in to give urine sample for pregnancy test to be able to start birth control patch. Wants prescription to be sent to [facility] [address].

## 2021-06-05 ENCOUNTER — Other Ambulatory Visit (HOSPITAL_COMMUNITY): Payer: Self-pay | Admitting: Nurse Practitioner

## 2021-06-05 MED ORDER — NORELGESTROMIN-ETH ESTRADIOL 150-35 MCG/24HR TD PTWK: 1.0000 | MEDICATED_PATCH | TRANSDERMAL | 3 refills | Status: DC

## 2021-06-05 NOTE — Progress Notes (Signed)
   [facility] [address] [place], [place]  [address] Phone:  [phone]   Fax:  [phone] 

## 2021-06-26 DIAGNOSIS — J4520 Mild intermittent asthma, uncomplicated: Secondary | ICD-10-CM

## 2021-06-26 MED ORDER — ALBUTEROL SULFATE HFA 108 (90 BASE) MCG/ACT IN AERS: 1.0000 | INHALATION_SPRAY | Freq: Four times a day (QID) | RESPIRATORY_TRACT | 1 refills | Status: DC | PRN

## 2021-07-30 ENCOUNTER — Encounter: Payer: Medicaid Other | Admitting: Nurse Practitioner

## 2021-09-07 ENCOUNTER — Encounter: Payer: Medicaid Other | Admitting: Nurse Practitioner

## 2021-10-05 ENCOUNTER — Encounter (HOSPITAL_COMMUNITY): Payer: Self-pay | Admitting: Emergency Medicine

## 2021-10-05 ENCOUNTER — Emergency Department (HOSPITAL_COMMUNITY): Payer: Medicaid Other

## 2021-10-05 ENCOUNTER — Other Ambulatory Visit: Payer: Self-pay

## 2021-10-05 ENCOUNTER — Emergency Department (HOSPITAL_COMMUNITY)
Admission: EM | Admit: 2021-10-05 | Discharge: 2021-10-05 | Disposition: A | Discharge status: Home / Self Care | Payer: Medicaid Other | Attending: Emergency Medicine | Admitting: Emergency Medicine

## 2021-10-05 DIAGNOSIS — Z7722 Contact with and (suspected) exposure to environmental tobacco smoke (acute) (chronic): Secondary | ICD-10-CM | POA: Insufficient documentation

## 2021-10-05 DIAGNOSIS — O209 Hemorrhage in early pregnancy, unspecified: Secondary | ICD-10-CM

## 2021-10-05 DIAGNOSIS — O3481 Maternal care for other abnormalities of pelvic organs, first trimester: Secondary | ICD-10-CM | POA: Diagnosis not present

## 2021-10-05 DIAGNOSIS — J45909 Unspecified asthma, uncomplicated: Secondary | ICD-10-CM | POA: Insufficient documentation

## 2021-10-05 DIAGNOSIS — N9489 Other specified conditions associated with female genital organs and menstrual cycle: Secondary | ICD-10-CM | POA: Insufficient documentation

## 2021-10-05 DIAGNOSIS — O23591 Infection of other part of genital tract in pregnancy, first trimester: Secondary | ICD-10-CM | POA: Insufficient documentation

## 2021-10-05 DIAGNOSIS — B9689 Other specified bacterial agents as the cause of diseases classified elsewhere: Secondary | ICD-10-CM | POA: Diagnosis not present

## 2021-10-05 DIAGNOSIS — Z3201 Encounter for pregnancy test, result positive: Secondary | ICD-10-CM | POA: Diagnosis not present

## 2021-10-05 DIAGNOSIS — N760 Acute vaginitis: Secondary | ICD-10-CM | POA: Diagnosis not present

## 2021-10-05 DIAGNOSIS — O99511 Diseases of the respiratory system complicating pregnancy, first trimester: Secondary | ICD-10-CM | POA: Insufficient documentation

## 2021-10-05 DIAGNOSIS — Z3A01 Less than 8 weeks gestation of pregnancy: Secondary | ICD-10-CM | POA: Diagnosis not present

## 2021-10-05 DIAGNOSIS — N939 Abnormal uterine and vaginal bleeding, unspecified: Secondary | ICD-10-CM | POA: Diagnosis not present

## 2021-10-05 LAB — URINALYSIS, ROUTINE W REFLEX MICROSCOPIC
Bilirubin Urine: NEGATIVE
Glucose, UA: NEGATIVE mg/dL
Hgb urine dipstick: NEGATIVE
Ketones, ur: NEGATIVE mg/dL
Leukocytes,Ua: NEGATIVE
Nitrite: NEGATIVE
Protein, ur: NEGATIVE mg/dL
Specific Gravity, Urine: 1.02 (ref 1.005–1.030)
pH: 7 (ref 5.0–8.0)

## 2021-10-05 LAB — CBC WITH DIFFERENTIAL/PLATELET
Abs Immature Granulocytes: 0.02 10*3/uL (ref 0.00–0.07)
Basophils Absolute: 0 10*3/uL (ref 0.0–0.1)
Basophils Relative: 1 %
Eosinophils Absolute: 0.1 10*3/uL (ref 0.0–0.5)
Eosinophils Relative: 1 %
HCT: 38.8 % (ref 36.0–46.0)
Hemoglobin: 12.4 g/dL (ref 12.0–15.0)
Immature Granulocytes: 0 %
Lymphocytes Relative: 33 %
Lymphs Abs: 2.5 10*3/uL (ref 0.7–4.0)
MCH: 26.9 pg (ref 26.0–34.0)
MCHC: 32 g/dL (ref 30.0–36.0)
MCV: 84.2 fL (ref 80.0–100.0)
Monocytes Absolute: 0.9 10*3/uL (ref 0.1–1.0)
Monocytes Relative: 12 %
Neutro Abs: 4.2 10*3/uL (ref 1.7–7.7)
Neutrophils Relative %: 53 %
Platelets: 384 10*3/uL (ref 150–400)
RBC: 4.61 MIL/uL (ref 3.87–5.11)
RDW: 15.9 % — ABNORMAL HIGH (ref 11.5–15.5)
WBC: 7.8 10*3/uL (ref 4.0–10.5)
nRBC: 0 % (ref 0.0–0.2)

## 2021-10-05 LAB — BASIC METABOLIC PANEL
Anion gap: 7 (ref 5–15)
BUN: 14 mg/dL (ref 6–20)
CO2: 25 mmol/L (ref 22–32)
Calcium: 9.4 mg/dL (ref 8.9–10.3)
Chloride: 102 mmol/L (ref 98–111)
Creatinine, Ser: 0.89 mg/dL (ref 0.44–1.00)
GFR, Estimated: >60 mL/min (ref >60)
Glucose, Bld: 66 mg/dL — ABNORMAL LOW (ref 70–99)
Potassium: 3.7 mmol/L (ref 3.5–5.1)
Sodium: 134 mmol/L — ABNORMAL LOW (ref 135–145)

## 2021-10-05 LAB — WET PREP, GENITAL
Sperm: NONE SEEN
Trich, Wet Prep: NONE SEEN
WBC, Wet Prep HPF POC: <10 (ref <10)
Yeast Wet Prep HPF POC: NONE SEEN

## 2021-10-05 LAB — HCG, QUANTITATIVE, PREGNANCY: hCG, Beta Chain, Quant, S: 42247 m[IU]/mL — ABNORMAL HIGH (ref <5)

## 2021-10-05 MED ORDER — METRONIDAZOLE 500 MG PO TABS: 500.0000 mg | ORAL_TABLET | Freq: Two times a day (BID) | ORAL | 0 refills | Status: DC

## 2021-10-05 MED ORDER — PRENATAL VITAMIN 27-0.8 MG PO TABS: 1.0000 | ORAL_TABLET | Freq: Every day | ORAL | 0 refills | Status: DC

## 2021-10-05 MED ORDER — SODIUM CHLORIDE 0.9 % IV BOLUS
1000.0000 mL | Freq: Once | INTRAVENOUS | Status: AC
  Administered 2021-10-05: 1000 mL via INTRAVENOUS

## 2021-10-05 NOTE — Discharge Instructions (Addendum)
Due date: 05/30/22

## 2021-10-05 NOTE — ED Provider Notes (Signed)
Emergency Medicine Provider Triage Evaluation Note  Susan Moore , a 20 y.o. female  was evaluated in triage.  Pt complains of bleeding in setting of positive home pregnancy test.  G2 P1.  Not currently followed by OB/GYN.  Having some lower abdominal cramping and some vaginal bleeding onset around noon.  She has had to change her pad twice.  No lightheadedness or dizziness.   LMP Oct 20  O positive blood type 05/18/2020  Review of Systems  Positive: Vaginal bleeding in pregnancy Negative:   Physical Exam  BP 119/70 (BP Location: Right Arm)   Pulse 91   Temp 98.7 F (37.1 C) (Oral)   Resp 18   SpO2 100%  Gen:   Awake, no distress   Resp:  Normal effort  MSK:   Moves extremities without difficulty  Other:    Medical Decision Making  Medically screening exam initiated at 7:46 PM.  Appropriate orders placed.  Susan Moore was informed that the remainder of the evaluation will be completed by another provider, this initial triage assessment does not replace that evaluation, and the importance of remaining in the ED until their evaluation is complete.  No bleeding, positive home pregnancy test   Susan Elm, PA-C 10/05/21 1947    Hayden Rasmussen, MD 10/06/21 1053

## 2021-10-05 NOTE — ED Provider Notes (Signed)
[facility] DEPT Provider Note   CSN: [id number] Arrival date & time: 10/05/21  1907     History Chief Complaint  Patient presents with   Vaginal Bleeding    Susan Moore is a 20 y.o. female.  Pt presents to the ED today with vaginal bleeding.  Pt's LMP was 10/20.  She had a + home pregnancy test about 2 weeks ago.  Pt said she had 1 prior pregnancy with no problems and has a healthy 22 year old.  Pt has not made an appt with obgyn yet for this pregnancy.  Pt said bleeding and cramping started around noon.  Blood type O+ per epic review.      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   + Alpha Thalassemia carrier Increased SMA risk  Info for partner testing given 1/27 [x]  genetic counseling with MFM 01/20/20   Gestational diabetes mellitus (GDM) in third trimester controlled on oral hypoglycemic drug 05/18/2020    Patient Active Problem List   Diagnosis Date Noted   Acute mastitis 07/12/2020   Asthma     Past Surgical History:  Procedure Laterality Date   CESAREAN SECTION  05/19/2020   Procedure: CESAREAN SECTION;  Surgeon: Donnamae Jude, MD;  Location: [facility] ORS;  Service: Obstetrics;;  Arrest of Dilation, Fetal Tachycardia   WISDOM TOOTH EXTRACTION       OB History     Gravida  1   Para      Term      Preterm      AB      Living  0      SAB      IAB      Ectopic      Multiple      Live Births              History reviewed. No pertinent family history.  Social History   Tobacco Use   Smoking status: Never    Passive exposure: Yes   Smokeless tobacco: Never  Vaping Use   Vaping Use: Never used  Substance Use Topics   Alcohol use: No   Drug use: Yes    Types: Marijuana    Comment: last used when found out pregnant    Home Medications Prior to Admission medications   Medication Sig Start  Date End Date Taking? Authorizing Provider  metroNIDAZOLE (FLAGYL) 500 MG tablet Take 1 tablet (500 mg total) by mouth 2 (two) times daily. 10/05/21  Yes Isla Pence, MD  Prenatal Vit-Fe Fumarate-FA (PRENATAL VITAMIN) 27-0.8 MG TABS Take 1 tablet by mouth daily at 6 (six) AM. 10/05/21  Yes Isla Pence, MD  albuterol (PROVENTIL) (2.5 MG/3ML) 0.083% nebulizer solution Take 3 mLs (2.5 mg total) by nebulization every 6 (six) hours as needed for wheezing. 04/12/20   [name], MD  albuterol (VENTOLIN HFA) 108 (90 Base) MCG/ACT inhaler Inhale 1-2 puffs into the lungs every 6 (six) hours as needed for wheezing or shortness of breath. 06/26/21   Vevelyn Francois, NP  norelgestromin-ethinyl estradiol (ORTHO EVRA) 150-35 MCG/24HR transdermal patch Place 1 patch onto the skin once a week. for 3 weeks. After that, the patch is taken off for 1 week. 06/05/21 06/05/22  Vevelyn Francois, NP    Allergies    Patient has no known allergies.  Review of Systems  Review of Systems  Genitourinary:  Positive for vaginal bleeding.  All other systems reviewed and are negative.  Physical Exam Updated Vital Signs BP 117/78   Pulse 84   Temp 99.1 F (37.3 C) (Oral)   Resp 18   LMP 08/30/2021 (Exact Date)   SpO2 100%   Physical Exam Vitals and nursing note reviewed. Exam conducted with a chaperone present.  Constitutional:      Appearance: Normal appearance.  HENT:     Head: Normocephalic and atraumatic.     Right Ear: External ear normal.     Left Ear: External ear normal.     Nose: Nose normal.     Mouth/Throat:     Mouth: Mucous membranes are dry.  Eyes:     Extraocular Movements: Extraocular movements intact.     Conjunctiva/sclera: Conjunctivae normal.     Pupils: Pupils are equal, round, and reactive to light.  Cardiovascular:     Rate and Rhythm: Normal rate and regular rhythm.     Pulses: Normal pulses.     Heart sounds: Normal heart sounds.  Pulmonary:     Effort: Pulmonary  effort is normal.     Breath sounds: Normal breath sounds.  Abdominal:     General: Abdomen is flat. Bowel sounds are normal.     Palpations: Abdomen is soft.  Genitourinary:    Exam position: Lithotomy position.     Vagina: Normal.     Cervix: Normal.     Uterus: Normal.      Adnexa: Right adnexa normal and left adnexa normal.  Musculoskeletal:        General: Normal range of motion.     Cervical back: Normal range of motion and neck supple.  Skin:    General: Skin is warm.     Capillary Refill: Capillary refill takes less than 2 seconds.  Neurological:     General: No focal deficit present.     Mental Status: She is alert and oriented to person, place, and time.  Psychiatric:        Mood and Affect: Mood normal.        Behavior: Behavior normal.    ED Results / Procedures / Treatments   Labs (all labs ordered are listed, but only abnormal results are displayed) Labs Reviewed  WET PREP, GENITAL - Abnormal; Notable for the following components:      Result Value   Clue Cells Wet Prep HPF POC PRESENT (*)    All other components within normal limits  HCG, QUANTITATIVE, PREGNANCY - Abnormal; Notable for the following components:   hCG, Beta Chain, Quant, S 42,247 (*)    All other components within normal limits  CBC WITH DIFFERENTIAL/PLATELET - Abnormal; Notable for the following components:   RDW 15.9 (*)    All other components within normal limits  BASIC METABOLIC PANEL - Abnormal; Notable for the following components:   Sodium 134 (*)    Glucose, Bld 66 (*)    All other components within normal limits  URINALYSIS, ROUTINE W REFLEX MICROSCOPIC - Abnormal; Notable for the following components:   APPearance HAZY (*)    All other components within normal limits  GC/CHLAMYDIA PROBE AMP () NOT AT [facility]    EKG None  Radiology US OB Comp < 14 Wks  Result Date: 10/05/2021 CLINICAL DATA:  Vaginal bleeding x1 day. EXAM: OBSTETRIC <14 WK ULTRASOUND TECHNIQUE:  Transabdominal ultrasound was performed for evaluation of the gestation as well as the maternal uterus  and adnexal regions. COMPARISON:  None. FINDINGS: Intrauterine gestational sac: Single Yolk sac:  Visualized. Embryo:  Visualized. Cardiac Activity: Visualized. Heart Rate: 129 bpm CRL:   5.0 mm   6 w 1 d                  [place] EDC: May 30, 2022 Subchorionic hemorrhage:  None visualized. Maternal uterus/adnexae: A 1.9 cm x 1.7 cm x 1.6 cm complex right ovarian cyst is seen. The left ovary is visualized and is normal in appearance. No pelvic free fluid is seen. IMPRESSION: 1. Single, viable intrauterine pregnancy at approximately 6 weeks and 1 day gestation by ultrasound evaluation. 2. Complex right ovarian cyst. Correlation with 6-12 month follow-up pelvic ultrasound is recommended to confirm resolution. This recommendation follows the consensus statement: Management of Asymptomatic Ovarian and Other Adnexal Cysts Imaged at [place]: Society of Radiologists in [place]. Radiology 2010; [phone]. Electronically Signed   By: Virgina Norfolk M.D.   On: 10/05/2021 21:57   US OB Transvaginal  Result Date: 10/05/2021 CLINICAL DATA:  Bleeding for 1 day.  Positive pregnancy test. EXAM: OBSTETRIC <14 WK [place] AND TRANSVAGINAL OB US TECHNIQUE: Both transabdominal and transvaginal ultrasound examinations were performed for complete evaluation of the gestation as well as the maternal uterus, adnexal regions, and pelvic cul-de-sac. Transvaginal technique was performed to assess early pregnancy. COMPARISON:  None. FINDINGS: Intrauterine gestational sac: Single Yolk sac:  Visualized. Embryo:  Visualized. Cardiac Activity: Visualized. Heart Rate: 129 bpm CRL:  5.0 mm   6 w   1 d                  [place] EDC: 05/30/2022 Subchorionic hemorrhage:  None visualized. Maternal uterus/adnexae: Within normal limits. Likely corpus luteum in the right ovary. No free fluid. IMPRESSION: Single live intrauterine  gestation measuring 6 weeks 1 day by crown-rump length. Electronically Signed   By: Ronney Asters M.D.   On: 10/05/2021 21:58    Procedures Procedures   Medications Ordered in ED Medications  sodium chloride 0.9 % bolus 1,000 mL (1,000 mLs Intravenous New Bag/Given 10/05/21 2128)    ED Course  I have reviewed the triage vital signs and the nursing notes.  Pertinent labs & imaging results that were available during my care of the patient were reviewed by me and considered in my medical decision making (see chart for details).    MDM Rules/Calculators/A&P                           US shows IUP with Due date of 05/30/22.  No evidence of subchorionic hemorrhage.  Fetus is 6 weeks and 1 day.  Wet prep + for Clue cells, so pt started on flagyl.  Pt instructed to have vaginal rest and to f/u with obgyn.  Return if worse.   Final Clinical Impression(s) / ED Diagnoses Final diagnoses:  Bleeding in early pregnancy  BV (bacterial vaginosis)    Rx / DC Orders ED Discharge Orders          Ordered    Prenatal Vit-Fe Fumarate-FA (PRENATAL VITAMIN) 27-0.8 MG TABS  Daily        10/05/21 2229    metroNIDAZOLE (FLAGYL) 500 MG tablet  2 times daily        10/05/21 2236             Isla Pence, MD 10/05/21 2236

## 2021-10-05 NOTE — ED Triage Notes (Signed)
Patient reports vaginal bleeding which started earlier today. Reports positive pregnancy test approximately 2 weeks ago. States bleeding is moderate with cramping. Denies any other symptoms. Has not been seen by OB/GYN

## 2021-10-08 ENCOUNTER — Telehealth: Payer: Self-pay

## 2021-10-08 LAB — GC/CHLAMYDIA PROBE AMP (~~LOC~~) NOT AT ARMC
Chlamydia: NEGATIVE
Comment: NEGATIVE
Comment: NORMAL
Neisseria Gonorrhea: NEGATIVE

## 2021-10-08 NOTE — Telephone Encounter (Signed)
Transition Care Management Follow-up Telephone Call Date of discharge and from where: 10/05/2021 from [facility] How have you been since you were released from the hospital? Pt stated that she is feeling well and does not have any questions or concerns at this time.  Any questions or concerns? No  Items Reviewed: Did the pt receive and understand the discharge instructions provided? Yes  Medications obtained and verified? Yes  Other? No  Any new allergies since your discharge? No  Dietary orders reviewed? No Do you have support at home? Yes   Functional Questionnaire: (I = Independent and D = Dependent) ADLs: I  Bathing/Dressing- I  Meal Prep- I  Eating- I  Maintaining continence- I  Transferring/Ambulation- I  Managing Meds- I  Follow up appointments reviewed:  PCP Hospital f/u appt confirmed? No   Specialist Hospital f/u appt confirmed? Yes  Scheduled to see OBGYN  Are transportation arrangements needed? No  If their condition worsens, is the pt aware to call PCP or go to the Emergency Dept.? Yes Was the patient provided with contact information for the PCP's office or ED? Yes Was to pt encouraged to call back with questions or concerns? Yes

## 2021-10-15 ENCOUNTER — Other Ambulatory Visit: Payer: Self-pay

## 2021-10-15 ENCOUNTER — Ambulatory Visit (INDEPENDENT_AMBULATORY_CARE_PROVIDER_SITE_OTHER): Payer: Medicaid Other | Admitting: Nurse Practitioner

## 2021-10-15 ENCOUNTER — Encounter: Payer: Self-pay | Admitting: Nurse Practitioner

## 2021-10-15 VITALS — BP 113/60 | HR 80 | Temp 98.6°F | Ht 63.0 in | Wt 177.8 lb

## 2021-10-15 DIAGNOSIS — Z0000 Encounter for general adult medical examination without abnormal findings: Secondary | ICD-10-CM | POA: Diagnosis not present

## 2021-10-15 DIAGNOSIS — Z23 Encounter for immunization: Secondary | ICD-10-CM | POA: Diagnosis not present

## 2021-10-15 DIAGNOSIS — J4520 Mild intermittent asthma, uncomplicated: Secondary | ICD-10-CM

## 2021-10-15 LAB — POCT URINALYSIS DIP (CLINITEK)
Bilirubin, UA: NEGATIVE
Blood, UA: NEGATIVE
Glucose, UA: NEGATIVE mg/dL
Ketones, POC UA: NEGATIVE mg/dL
Leukocytes, UA: NEGATIVE
Nitrite, UA: NEGATIVE
POC PROTEIN,UA: NEGATIVE
Spec Grav, UA: 1.02 (ref 1.010–1.025)
Urobilinogen, UA: 0.2 E.U./dL
pH, UA: 7 (ref 5.0–8.0)

## 2021-10-15 MED ORDER — ALBUTEROL SULFATE (2.5 MG/3ML) 0.083% IN NEBU: 2.5000 mg | INHALATION_SOLUTION | Freq: Four times a day (QID) | RESPIRATORY_TRACT | 1 refills | Status: DC | PRN

## 2021-10-15 MED ORDER — ALBUTEROL SULFATE HFA 108 (90 BASE) MCG/ACT IN AERS: 1.0000 | INHALATION_SPRAY | Freq: Four times a day (QID) | RESPIRATORY_TRACT | 1 refills | Status: DC | PRN

## 2021-10-15 NOTE — Patient Instructions (Signed)

## 2021-10-15 NOTE — Progress Notes (Signed)
[place] [place], [place]  [address] Phone:  [phone]   Fax:  [phone]   Established Patient Office Visit  Subjective:  Patient ID: Susan Moore, female    DOB: 2001/02/03  Age: 20 y.o. MRN: [id number]  CC:  Chief Complaint  Patient presents with   Follow-up    Pt is here today for her follow up visit.  No concerns or issues to discuss today.    HPI Lailany Enoch presents for follow up.  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  Asthma Patient presents for evaluation of dyspnea and wheezing. The patient has been previously diagnosed with asthma. Symptoms currently include dyspnea and wheezing and occur daily. Observed precipitants include: occupational exposure. Current limitations in activity from asthma: none. Number of days of school or work missed in the last month: 0.  Does she do nebulizer treatments? yes Does she use an inhaler? yes Does she use a spacer with MDIs? no Does she monitor peak flow rates? no   She is in today for a follow up. She is sexually active. She is currently on the transdermal patch.  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   + Alpha Thalassemia carrier Increased SMA risk  Info for partner testing given 1/27 [x]  genetic counseling with MFM 01/20/20   Gestational diabetes mellitus (GDM) in third trimester controlled on oral hypoglycemic drug 05/18/2020    Past Surgical History:  Procedure Laterality Date   CESAREAN SECTION  05/19/2020   Procedure: CESAREAN SECTION;  Surgeon: Donnamae Jude, MD;  Location: [facility];  Service: Obstetrics;;  Arrest of Dilation, Fetal Tachycardia   WISDOM  TOOTH EXTRACTION      Family History  Family history unknown: Yes    Social History   Socioeconomic History   Marital status: Single    Spouse name: Not on file   Number of children: Not on file   Years of education: Not on file   Highest education level: Not on file  Occupational History   Not on file  Tobacco Use   Smoking status: Never    Passive exposure: Yes   Smokeless tobacco: Never  Vaping Use   Vaping Use: Never used  Substance and Sexual Activity   Alcohol use: No   Drug use: Yes    Types: Marijuana    Comment: last used when found out pregnant   Sexual activity: Not Currently    Birth control/protection: Patch  Other Topics Concern   Not on file  Social History Narrative   Not on file   Social Determinants of Health   Financial Resource Strain: Not on file  Food Insecurity: Not on file  Transportation Needs: Not on file  Physical Activity: Not on file  Stress: Not on file  Social Connections: Not on file  Intimate Partner Violence: Not on file    Outpatient Medications Prior to Visit  Medication Sig Dispense Refill   norelgestromin-ethinyl estradiol (ORTHO EVRA) 150-35 MCG/24HR transdermal patch Place 1 patch onto the skin once a week. for 3 weeks. After that, the patch is taken off for 1 week. 12 patch 3   albuterol (PROVENTIL) (2.5 MG/3ML) 0.083% nebulizer solution  Take 3 mLs (2.5 mg total) by nebulization every 6 (six) hours as needed for wheezing. 75 mL 1   albuterol (VENTOLIN HFA) 108 (90 Base) MCG/ACT inhaler Inhale 1-2 puffs into the lungs every 6 (six) hours as needed for wheezing or shortness of breath. 6.7 g 1   metroNIDAZOLE (FLAGYL) 500 MG tablet Take 1 tablet (500 mg total) by mouth 2 (two) times daily. (Patient not taking: Reported on 10/15/2021) 14 tablet 0   Prenatal Vit-Fe Fumarate-FA (PRENATAL VITAMIN) 27-0.8 MG TABS Take 1 tablet by mouth daily at 6 (six) AM. (Patient not taking: Reported on 10/15/2021) 30 tablet 0   No  facility-administered medications prior to visit.    No Known Allergies  ROS Review of Systems    Objective:    Physical Exam Constitutional:      Appearance: She is obese.  HENT:     Head: Normocephalic and atraumatic.     Nose: Nose normal.     Mouth/Throat:     Mouth: Mucous membranes are moist.  Cardiovascular:     Rate and Rhythm: Normal rate and regular rhythm.     Pulses: Normal pulses.     Heart sounds: Normal heart sounds.  Pulmonary:     Effort: Pulmonary effort is normal. No respiratory distress.     Breath sounds: No wheezing, rhonchi or rales.     Comments: diminshed Chest:     Chest wall: No tenderness.  Abdominal:     Palpations: Abdomen is soft.  Musculoskeletal:        General: Normal range of motion.     Cervical back: Normal range of motion.     Right lower leg: No edema.     Left lower leg: No edema.  Skin:    General: Skin is warm and dry.     Capillary Refill: Capillary refill takes less than 2 seconds.  Neurological:     General: No focal deficit present.     Mental Status: She is alert and oriented to person, place, and time.  Psychiatric:        Mood and Affect: Mood normal.        Behavior: Behavior normal.        Thought Content: Thought content normal.        Judgment: Judgment normal.    BP 113/60   Pulse 80   Temp 98.6 F (37 C)   Ht 5\' 3"  (1.6 m)   Wt 177 lb 12.8 oz (80.6 kg)   SpO2 100%   BMI 31.50 kg/m  Wt Readings from Last 3 Encounters:  10/15/21 177 lb 12.8 oz (80.6 kg)  01/24/21 173 lb (78.5 kg)  08/03/20 184 lb 12.8 oz (83.8 kg)     Health Maintenance Due  Topic Date Due   INFLUENZA VACCINE  06/11/2021    There are no preventive care reminders to display for this patient.   No results found for: TSH Lab Results  Component Value Date   WBC 7.8 10/05/2021   HGB 12.4 10/05/2021   HCT 38.8 10/05/2021   MCV 84.2 10/05/2021   PLT 384 10/05/2021   Lab Results  Component Value Date   NA 134 (L)  10/05/2021   K 3.7 10/05/2021   CO2 25 10/05/2021   GLUCOSE 66 (L) 10/05/2021   BUN 14 10/05/2021   CREATININE 0.89 10/05/2021   BILITOT 0.8 07/09/2020   ALKPHOS 129 (H) 07/09/2020   AST 23 07/09/2020   ALT 34 07/09/2020   PROT  7.8 07/09/2020   ALBUMIN 3.6 07/09/2020   CALCIUM 9.4 10/05/2021   ANIONGAP 7 10/05/2021   No results found for: CHOL No results found for: HDL No results found for: LDLCALC No results found for: TRIG No results found for: CHOLHDL Lab Results  Component Value Date   HGBA1C 5.2 10/27/2019      Assessment & Plan:   Problem List Items Addressed This Visit       Respiratory   Asthma Stable  Continue with current regimen.  No changes warranted. Good patient compliance.    Relevant Medications   albuterol (VENTOLIN HFA) 108 (90 Base) MCG/ACT inhaler   albuterol (PROVENTIL) (2.5 MG/3ML) 0.083% nebulizer solution   Other Visit Diagnoses     Healthcare maintenance    -  Primary   Relevant Orders   POCT URINALYSIS DIP (CLINITEK) (Completed)   Needs flu shot       Relevant Orders   Flu Vaccine QUAD 59mo+IM (Fluarix, Fluzone & Alfiuria Quad PF)       Meds ordered this encounter  Medications   albuterol (VENTOLIN HFA) 108 (90 Base) MCG/ACT inhaler    Sig: Inhale 1-2 puffs into the lungs every 6 (six) hours as needed for wheezing or shortness of breath.    Dispense:  6.7 g    Refill:  1    Order Specific Question:   Supervising Provider    Answer:   Tresa Garter [[id number]]   albuterol (PROVENTIL) (2.5 MG/3ML) 0.083% nebulizer solution    Sig: Take 3 mLs (2.5 mg total) by nebulization every 6 (six) hours as needed for wheezing.    Dispense:  75 mL    Refill:  1    Order Specific Question:   Supervising Provider    Answer:   Tresa Garter [[id number]]    Follow-up: Return in about 1 month (around 11/15/2021).    Vevelyn Francois, NP

## 2021-11-15 ENCOUNTER — Ambulatory Visit: Payer: Medicaid Other | Admitting: Nurse Practitioner

## 2021-12-05 ENCOUNTER — Ambulatory Visit: Payer: Medicaid Other | Admitting: Nurse Practitioner

## 2021-12-12 ENCOUNTER — Other Ambulatory Visit: Payer: Self-pay | Admitting: Nurse Practitioner

## 2021-12-12 DIAGNOSIS — J4520 Mild intermittent asthma, uncomplicated: Secondary | ICD-10-CM

## 2022-01-11 ENCOUNTER — Other Ambulatory Visit: Payer: Self-pay | Admitting: Nurse Practitioner

## 2022-01-11 ENCOUNTER — Other Ambulatory Visit: Payer: Self-pay

## 2022-01-11 ENCOUNTER — Encounter: Payer: Self-pay | Admitting: Nurse Practitioner

## 2022-01-11 ENCOUNTER — Ambulatory Visit: Payer: Medicaid Other | Admitting: Nurse Practitioner

## 2022-01-11 VITALS — BP 132/82 | HR 76 | Temp 98.1°F | Ht 63.0 in | Wt 170.2 lb

## 2022-01-11 DIAGNOSIS — Z0000 Encounter for general adult medical examination without abnormal findings: Secondary | ICD-10-CM | POA: Diagnosis not present

## 2022-01-11 DIAGNOSIS — Z114 Encounter for screening for human immunodeficiency virus [HIV]: Secondary | ICD-10-CM | POA: Diagnosis not present

## 2022-01-11 DIAGNOSIS — Z113 Encounter for screening for infections with a predominantly sexual mode of transmission: Secondary | ICD-10-CM | POA: Diagnosis not present

## 2022-01-11 DIAGNOSIS — Z1159 Encounter for screening for other viral diseases: Secondary | ICD-10-CM | POA: Diagnosis not present

## 2022-01-11 LAB — POCT URINALYSIS DIP (CLINITEK)
Bilirubin, UA: NEGATIVE
Blood, UA: NEGATIVE
Glucose, UA: NEGATIVE mg/dL
Ketones, POC UA: NEGATIVE mg/dL
Leukocytes, UA: NEGATIVE
Nitrite, UA: NEGATIVE
POC PROTEIN,UA: NEGATIVE
Spec Grav, UA: 1.02 (ref 1.010–1.025)
Urobilinogen, UA: 1 E.U./dL
pH, UA: 7 (ref 5.0–8.0)

## 2022-01-11 NOTE — Progress Notes (Signed)
? ?[facility] ?[address] [place], [place]  [address] ?Phone:  [phone]   Fax:  [phone] ? ? ?Established Patient Office Visit ? ?Subjective:  ?Patient ID: Susan Moore, female    DOB: 12-11-2000  Age: 21 y.o. MRN: [id number] ? ?CC:  ?Chief Complaint  ?Patient presents with  ? Follow-up  ?  Pt is here today for her 1 month follow up and would like to get some STD testing including swab done due to having a new sex partner.  ? ? ?HPI ?Susan Moore presents for follow up. She . 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  ? ?Denies any pelvic pain or tenderness, amenorrhea irregular bleeding or prolonged heavy bleeding.  Denies vaginal discharge or dysuria.  Denies ulcers or lesions ? ?Past Medical History:  ?Diagnosis Date  ? Asthma   ? Chlamydia   ? GDM (gestational diabetes mellitus) 03/2020  ? 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 Gestational diabetes mellitus (GDM) in third trimester controlled on oral hypoglycemic drug 05/18/2020  ? ? ?Past Surgical History:  ?Procedure Laterality Date  ? CESAREAN SECTION  05/19/2020  ? Procedure: CESAREAN SECTION;  Surgeon: Donnamae Jude, MD;  Location: [facility];  Service: Obstetrics;;  Arrest of Dilation, Fetal Tachycardia  ? WISDOM TOOTH EXTRACTION    ? ? ?Family History  ?Family history unknown: Yes  ? ? ?Social History  ? ?Socioeconomic History  ? Marital status: Single  ?  Spouse name: Not on file  ? Number of children: Not on file  ? Years of education: Not on file  ? Highest education level: Not on file  ?Occupational History  ? Not on file  ?Tobacco Use  ? Smoking status: Never  ?  Passive exposure: Yes   ? Smokeless tobacco: Never  ?Vaping Use  ? Vaping Use: Never used  ?Substance and Sexual Activity  ? Alcohol use: No  ? Drug use: Yes  ?  Types: Marijuana  ?  Comment: last used when found out pregnant  ? Sexual activity: Yes  ?  Birth control/protection: Patch, None  ?Other Topics Concern  ? Not on file  ?Social History Narrative  ? Not on file  ? ?Social Determinants of Health  ? ?Financial Resource Strain: Not on file  ?Food Insecurity: Not on file  ?Transportation Needs: Not on file  ?Physical Activity: Not on file  ?Stress: Not on file  ?Social Connections: Not on file  ?Intimate Partner Violence: Not on file  ? ? ?Outpatient Medications Prior to Visit  ?Medication Sig Dispense Refill  ? albuterol (PROVENTIL) (2.5 MG/3ML) 0.083% nebulizer solution Take 3 mLs (2.5 mg total) by nebulization every 6 (six) hours as needed for wheezing. 75 mL 1  ? albuterol (VENTOLIN HFA) 108 (90 Base) MCG/ACT inhaler Inhale 1-2 puffs into the lungs every 6 (six) hours as needed for wheezing or shortness of breath. 6.7 g 1  ? norelgestromin-ethinyl estradiol (ORTHO EVRA) 150-35 MCG/24HR transdermal patch Place 1 patch onto the skin once a week. for 3 weeks. After that, the patch is taken off for 1 week. 12 patch 3  ? metroNIDAZOLE (FLAGYL) 500 MG tablet Take 1 tablet (500 mg total)  by mouth 2 (two) times daily. (Patient not taking: Reported on 10/15/2021) 14 tablet 0  ? Prenatal Vit-Fe Fumarate-FA (PRENATAL VITAMIN) 27-0.8 MG TABS Take 1 tablet by mouth daily at 6 (six) AM. (Patient not taking: Reported on 10/15/2021) 30 tablet 0  ? ?No facility-administered medications prior to visit.  ? ? ?No Known Allergies ? ?ROS ?Review of Systems ? ?  ?Objective:  ?  ?Physical Exam ?Constitutional:   ?   Appearance: She is obese.  ?HENT:  ?   Head: Normocephalic and atraumatic.  ?   Nose: Nose normal.  ?   Mouth/Throat:  ?   Mouth: Mucous membranes are moist.  ?Cardiovascular:  ?   Rate and Rhythm: Normal rate and regular rhythm.  ?   Pulses:  Normal pulses.  ?   Heart sounds: Normal heart sounds.  ?Pulmonary:  ?   Effort: Pulmonary effort is normal. No respiratory distress.  ?   Breath sounds: No wheezing, rhonchi or rales.  ?   Comments: diminshed ?Chest:  ?   Chest wall: No tenderness.  ?Abdominal:  ?   Palpations: Abdomen is soft.  ?Musculoskeletal:     ?   General: Normal range of motion.  ?   Cervical back: Normal range of motion.  ?   Right lower leg: No edema.  ?   Left lower leg: No edema.  ?Skin: ?   General: Skin is warm and dry.  ?   Capillary Refill: Capillary refill takes less than 2 seconds.  ?Neurological:  ?   General: No focal deficit present.  ?   Mental Status: She is alert and oriented to person, place, and time.  ?Psychiatric:     ?   Mood and Affect: Mood normal.     ?   Behavior: Behavior normal.     ?   Thought Content: Thought content normal.     ?   Judgment: Judgment normal.  ? ? ?BP 132/82   Pulse 76   Temp 98.1 ?F (36.7 ?C)   Ht 5\' 3"  (1.6 m)   Wt 170 lb 3.2 oz (77.2 kg)   LMP 12/28/2021 (Approximate) Comment: birth control patch  SpO2 100%   Breastfeeding No   BMI 30.15 kg/m?  ?Wt Readings from Last 3 Encounters:  ?01/11/22 170 lb 3.2 oz (77.2 kg)  ?10/15/21 177 lb 12.8 oz (80.6 kg)  ?01/24/21 173 lb (78.5 kg)  ? ? ? ?Health Maintenance Due  ?Topic Date Due  ? COVID-19 Vaccine (1) Never done  ? ? ?There are no preventive care reminders to display for this patient. ? ?No results found for: TSH ?Lab Results  ?Component Value Date  ? WBC 7.8 10/05/2021  ? HGB 12.4 10/05/2021  ? HCT 38.8 10/05/2021  ? MCV 84.2 10/05/2021  ? PLT 384 10/05/2021  ? ?Lab Results  ?Component Value Date  ? NA 134 (L) 10/05/2021  ? K 3.7 10/05/2021  ? CO2 25 10/05/2021  ? GLUCOSE 66 (L) 10/05/2021  ? BUN 14 10/05/2021  ? CREATININE 0.89 10/05/2021  ? BILITOT 0.8 07/09/2020  ? ALKPHOS 129 (H) 07/09/2020  ? AST 23 07/09/2020  ? ALT 34 07/09/2020  ? PROT 7.8 07/09/2020  ? ALBUMIN 3.6 07/09/2020  ? CALCIUM 9.4 10/05/2021  ? ANIONGAP 7 10/05/2021   ? ?No results found for: CHOL ?No results found for: HDL ?No results found for: [place] ?No results found for: TRIG ?No results found for: CHOLHDL ?Lab Results  ?Component Value Date  ?  HGBA1C 5.2 10/27/2019  ? ? ?  ?Assessment & Plan:  ? ?Problem List Items Addressed This Visit   ?None ?Visit Diagnoses   ? ? Healthcare maintenance    -  Primary ?  ? Relevant Orders  ? POCT URINALYSIS DIP (CLINITEK)  ? Screening for STD (sexually transmitted disease)      ? Relevant Orders  ? Hepatitis C antibody  ? HIV Antibody (routine testing w rflx)  ? NuSwab Vaginitis Plus (VG+)  ? Screening for HIV (human immunodeficiency virus)      ? Relevant Orders  ? HIV Antibody (routine testing w rflx)  ? Encounter for hepatitis C screening test for low risk patient      ? Relevant Orders  ? Hepatitis C antibody  ? ?  ? ? ?No orders of the defined types were placed in this encounter. ? ? ?Follow-up: Return for follow up as needed.  ? ? ?Vevelyn Francois, NP ?

## 2022-01-11 NOTE — Patient Instructions (Signed)
Preventive Care 61-23 Years Old, Female ?Preventive care refers to lifestyle choices and visits with your health care provider that can promote health and wellness. Preventive care visits are also called wellness exams. ?What can I expect for my preventive care visit? ?Counseling ?During your preventive care visit, your health care provider may ask about your: ?Medical history, including: ?Past medical problems. ?Family medical history. ?Pregnancy history. ?Current health, including: ?Menstrual cycle. ?Method of birth control. ?Emotional well-being. ?Home life and relationship well-being. ?Sexual activity and sexual health. ?Lifestyle, including: ?Alcohol, nicotine or tobacco, and drug use. ?Access to firearms. ?Diet, exercise, and sleep habits. ?Work and work Statistician. ?Sunscreen use. ?Safety issues such as seatbelt and bike helmet use. ?Physical exam ?Your health care provider may check your: ?Height and weight. These may be used to calculate your BMI (body mass index). BMI is a measurement that tells if you are at a healthy weight. ?Waist circumference. This measures the distance around your waistline. This measurement also tells if you are at a healthy weight and may help predict your risk of certain diseases, such as type 2 diabetes and high blood pressure. ?Heart rate and blood pressure. ?Body temperature. ?Skin for abnormal spots. ?What immunizations do I need? ?Vaccines are usually given at various ages, according to a schedule. Your health care provider will recommend vaccines for you based on your age, medical history, and lifestyle or other factors, such as travel or where you work. ?What tests do I need? ?Screening ?Your health care provider may recommend screening tests for certain conditions. This may include: ?Pelvic exam and Pap test. ?Lipid and cholesterol levels. ?Diabetes screening. This is done by checking your blood sugar (glucose) after you have not eaten for a while (fasting). ?Hepatitis B  test. ?Hepatitis C test. ?HIV (human immunodeficiency virus) test. ?STI (sexually transmitted infection) testing, if you are at risk. ?BRCA-related cancer screening. This may be done if you have a family history of breast, ovarian, tubal, or peritoneal cancers. ?Talk with your health care provider about your test results, treatment options, and if necessary, the need for more tests. ?Follow these instructions at home: ?Eating and drinking ? ?Eat a healthy diet that includes fresh fruits and vegetables, whole grains, lean protein, and low-fat dairy products. ?Take vitamin and mineral supplements as recommended by your health care provider. ?Do not drink alcohol if: ?Your health care provider tells you not to drink. ?You are pregnant, may be pregnant, or are planning to become pregnant. ?If you drink alcohol: ?Limit how much you have to 0-1 drink a day. ?Know how much alcohol is in your drink. In the [place] one drink equals one 12 oz bottle of beer (355 mL), one 5 oz glass of wine (148 mL), or one 1? oz glass of hard liquor (44 mL). ?Lifestyle ?Brush your teeth every morning and night with fluoride toothpaste. Floss one time each day. ?Exercise for at least 30 minutes 5 or more days each week. ?Do not use any products that contain nicotine or tobacco. These products include cigarettes, chewing tobacco, and vaping devices, such as e-cigarettes. If you need help quitting, ask your health care provider. ?Do not use drugs. ?If you are sexually active, practice safe sex. Use a condom or other form of protection to prevent STIs. ?If you do not wish to become pregnant, use a form of birth control. If you plan to become pregnant, see your health care provider for a prepregnancy visit. ?Find healthy ways to manage stress, such as: ?Meditation, yoga,  or listening to music. ?Journaling. ?Talking to a trusted person. ?Spending time with friends and family. ?Minimize exposure to UV radiation to reduce your risk of skin  cancer. ?Safety ?Always wear your seat belt while driving or riding in a vehicle. ?Do not drive: ?If you have been drinking alcohol. Do not ride with someone who has been drinking. ?If you have been using any mind-altering substances or drugs. ?While texting. ?When you are tired or distracted. ?Wear a helmet and other protective equipment during sports activities. ?If you have firearms in your house, make sure you follow all gun safety procedures. ?Seek help if you have been physically or sexually abused. ?What's next? ?Go to your health care provider once a year for an annual wellness visit. ?Ask your health care provider how often you should have your eyes and teeth checked. ?Stay up to date on all vaccines. ?This information is not intended to replace advice given to you by your health care provider. Make sure you discuss any questions you have with your health care provider. ?Document Revised: 04/25/2021 Document Reviewed: 04/25/2021 ?Elsevier Patient Education ? [place]. ? ?

## 2022-01-12 LAB — HIV ANTIBODY (ROUTINE TESTING W REFLEX): HIV Screen 4th Generation wRfx: NONREACTIVE

## 2022-01-12 LAB — HEPATITIS C ANTIBODY: Hep C Virus Ab: NONREACTIVE

## 2022-01-14 LAB — CHLAMYDIA/GONOCOCCUS/TRICHOMONAS, NAA

## 2022-01-15 DIAGNOSIS — A749 Chlamydial infection, unspecified: Secondary | ICD-10-CM | POA: Diagnosis not present

## 2022-01-15 LAB — NUSWAB VAGINITIS PLUS (VG+)
Candida albicans, NAA: NEGATIVE
Candida glabrata, NAA: NEGATIVE
Chlamydia trachomatis, NAA: POSITIVE — AB
Neisseria gonorrhoeae, NAA: NEGATIVE
Trich vag by NAA: NEGATIVE

## 2022-01-16 ENCOUNTER — Other Ambulatory Visit: Payer: Self-pay | Admitting: Nurse Practitioner

## 2022-01-16 MED ORDER — DOXYCYCLINE HYCLATE 100 MG PO CAPS: 100.0000 mg | ORAL_CAPSULE | Freq: Two times a day (BID) | ORAL | 0 refills | Status: AC

## 2022-01-23 ENCOUNTER — Other Ambulatory Visit: Payer: Self-pay | Admitting: Nurse Practitioner

## 2022-01-23 ENCOUNTER — Ambulatory Visit: Payer: Medicaid Other | Admitting: Nurse Practitioner

## 2022-01-23 MED ORDER — FLUCONAZOLE 150 MG PO TABS: 150.0000 mg | ORAL_TABLET | Freq: Every day | ORAL | 0 refills | Status: DC

## 2022-01-25 ENCOUNTER — Other Ambulatory Visit: Payer: Self-pay

## 2022-01-25 ENCOUNTER — Ambulatory Visit: Payer: Medicaid Other | Admitting: Nurse Practitioner

## 2022-01-25 ENCOUNTER — Encounter: Payer: Self-pay | Admitting: Nurse Practitioner

## 2022-01-25 VITALS — BP 117/74 | Temp 98.0°F | Ht 63.0 in | Wt 170.0 lb

## 2022-01-25 DIAGNOSIS — Z308 Encounter for other contraceptive management: Secondary | ICD-10-CM | POA: Diagnosis not present

## 2022-01-25 DIAGNOSIS — Z3201 Encounter for pregnancy test, result positive: Secondary | ICD-10-CM

## 2022-01-25 DIAGNOSIS — Z113 Encounter for screening for infections with a predominantly sexual mode of transmission: Secondary | ICD-10-CM

## 2022-01-25 LAB — POCT URINALYSIS DIP (CLINITEK)
Bilirubin, UA: NEGATIVE
Blood, UA: NEGATIVE
Glucose, UA: NEGATIVE mg/dL
Ketones, POC UA: NEGATIVE mg/dL
Leukocytes, UA: NEGATIVE
Nitrite, UA: NEGATIVE
POC PROTEIN,UA: 30 — AB
Spec Grav, UA: 1.025 (ref 1.010–1.025)
Urobilinogen, UA: 0.2 E.U./dL
pH, UA: 7 (ref 5.0–8.0)

## 2022-01-25 LAB — POCT URINE PREGNANCY: Preg Test, Ur: POSITIVE — AB

## 2022-01-25 NOTE — Patient Instructions (Addendum)
[name] ?This is your after visit summary for today.  ?1. Encounter for other contraceptive management ?- POCT URINALYSIS DIP (CLINITEK) ?- POCT urine pregnancy ?- hCG, quantitative, pregnancy ? ?2. Positive pregnancy test ?- hCG, quantitative, pregnancy ?- hCG, quantitative, pregnancy ?  ?No follow-ups on file.  ?Please feel free to call our office, if you have any questions. ?Have a good day. ?[name] NP ? ? ?Prenatal Care ?Prenatal care is health care during pregnancy. It helps you and your unborn baby (fetus) stay as healthy as possible. Prenatal care may be provided by a midwife, a family practice doctor, a IT consultant (nurse practitioner or physician assistant), or a childbirth and pregnancy doctor (obstetrician). ?How does this affect me? ?During pregnancy, you will be closely monitored for any new conditions that might develop. To lower your risk of pregnancy complications, you and your health care provider will talk about any underlying conditions you have. ?How does this affect my baby? ?Early and consistent prenatal care increases the chance that your baby will be healthy during pregnancy. Prenatal care lowers the risk that your baby will be: ?Born early (prematurely). ?Smaller than expected at birth (small for gestational age). ?What can I expect at the first prenatal care visit? ?Your first prenatal care visit will likely be the longest. You should schedule your first prenatal care visit as soon as you know that you are pregnant. Your first visit is a good time to talk about any questions or concerns you have about pregnancy. ?Medical history ?At your visit, you and your health care provider will talk about your medical history, including: ?Any past pregnancies. ?Your family's medical history. ?Medical history of the baby's father. ?Any long-term (chronic) health conditions you have and how you manage them. ?Any surgeries or procedures you have had. ?Any current over-the-counter or  prescription medicines, herbs, or supplements that you are taking. ?Other factors that could pose a risk to your baby, including: ?Exposure to harmful chemicals or radiation at work or at home. ?Any substance use, including tobacco, alcohol, and drug use. ?Your home setting and your stress levels, including: ?Exposure to abuse or violence. ?Household financial strain. ?Your daily health habits, including diet and exercise. ?Tests and screenings ?Your health care provider will: ?Measure your weight, height, and blood pressure. ?Do a physical exam, including a pelvic and breast exam. ?Perform blood tests and urine tests to check for: ?Urinary tract infection. ?Sexually transmitted infections (STIs). ?Low iron levels in your blood (anemia). ?Blood type and certain proteins on red blood cells (Rh antibodies). ?Infections and immunity to viruses, such as hepatitis B and rubella. ?HIV (human immunodeficiency virus). ?Discuss your options for genetic screening. ?Tips about staying healthy ?Your health care provider will also give you information about how to keep yourself and your baby healthy, including: ?Nutrition and taking vitamins. ?Physical activity. ?How to manage pregnancy symptoms such as nausea and vomiting (morning sickness). ?Infections and substances that may be harmful to your baby and how to avoid them. ?Food safety. ?Dental care. ?Working. ?Travel. ?Warning signs to watch for and when to call your health care provider. ?How often will I have prenatal care visits? ?After your first prenatal care visit, you will have regular visits throughout your pregnancy. The visit schedule is often as follows: ?Up to week 28 of pregnancy: once every 4 weeks. ?28-36 weeks: once every 2 weeks. ?After 36 weeks: every week until delivery. ?Some women may have visits more or less often depending on any underlying health conditions  and the health of the baby. ?Keep all follow-up and prenatal care visits. This is  important. ?What happens during routine prenatal care visits? ?Your health care provider will: ?Measure your weight and blood pressure. ?Check for fetal heart sounds. ?Measure the height of your uterus in your abdomen (fundal height). This may be measured starting around week 20 of pregnancy. ?Check the position of your baby inside your uterus. ?Ask questions about your diet, sleeping patterns, and whether you can feel the baby move. ?Review warning signs to watch for and signs of labor. ?Ask about any pregnancy symptoms you are having and how you are dealing with them. Symptoms may include: ?Headaches. ?Nausea and vomiting. ?Vaginal discharge. ?Swelling. ?Fatigue. ?Constipation. ?Changes in your vision. ?Feeling persistently sad or anxious. ?Any discomfort, including back or pelvic pain. ?Bleeding or spotting. ?Make a list of questions to ask your health care provider at your routine visits. ?What tests might I have during prenatal care visits? ?You may have blood, urine, and imaging tests throughout your pregnancy, such as: ?Urine tests to check for glucose, protein, or signs of infection. ?Glucose tests to check for a form of diabetes that can develop during pregnancy (gestational diabetes mellitus). This is usually done around week 24 of pregnancy. ?Ultrasounds to check your baby's growth and development, to check for birth defects, and to check your baby's well-being. These can also help to decide when you should deliver your baby. ?A test to check for group B strep (GBS) infection. This is usually done around week 36 of pregnancy. ?Genetic testing. This may include blood, fluid, or tissue sampling, or imaging tests, such as an ultrasound. Some genetic tests are done during the first trimester and some are done during the second trimester. ?What else can I expect during prenatal care visits? ?Your health care provider may recommend getting certain vaccines during pregnancy. These may include: ?A yearly flu shot  (annual influenza vaccine). This is especially important if you will be pregnant during flu season. ?Tdap (tetanus, diphtheria, pertussis) vaccine. Getting this vaccine during pregnancy can protect your baby from whooping cough (pertussis) after birth. This vaccine may be recommended between weeks 27 and 36 of pregnancy. ?A COVID-19 vaccine. ?Later in your pregnancy, your health care provider may give you information about: ?Childbirth and breastfeeding classes. ?Choosing a health care provider for your baby. ?Umbilical cord banking. ?Breastfeeding. ?Birth control after your baby is born. ?The hospital labor and delivery unit and how to set up a tour. ?Registering at the hospital before you go into labor. ?Where to find more information ?Office on Women's Health: LegalWarrants.gl ?American Pregnancy Association: americanpregnancy.org ?March of Dimes: marchofdimes.org ?Summary ?Prenatal care helps you and your baby stay as healthy as possible during pregnancy. ?Your first prenatal care visit will most likely be the longest. ?You will have visits and tests throughout your pregnancy to monitor your health and your baby's health. ?Bring a list of questions to your visits to ask your health care provider. ?Make sure to keep all follow-up and prenatal care visits. ?This information is not intended to replace advice given to you by your health care provider. Make sure you discuss any questions you have with your health care provider. ?Document Revised: 08/10/2020 Document Reviewed: 08/10/2020 ?Elsevier Patient Education ? [place]. ? ?

## 2022-01-25 NOTE — Progress Notes (Signed)
? ?[facility] ?[place], [place]  [address] ?Phone:  [phone]   Fax:  [phone] ? ? ?Established Patient Office Visit ? ?Subjective:  ?Patient ID: Susan Moore, female    DOB: 2001/11/11  Age: 21 y.o. MRN: [id number] ? ?CC:  ?Chief Complaint  ?Patient presents with  ? Follow-up  ?  Patient was here today to discuss her test results and to see if she can get tested again for STD to make sure it is clear. Patient is not has not been having any symptoms. Patient has complete her antibiotics that was prescribed.  ? ? ?HPI ?Susan Moore presents for follow up. 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  ? ?She is in today for resting of STD. She has completed her treatment. She is also requesting a refill on her birth control. Her urine pregnancy test was positive. ? ?Past Medical History:  ?Diagnosis Date  ? Asthma   ? Chlamydia   ? GDM (gestational diabetes mellitus) 03/2020  ? 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 + Alpha Thalassemia carrier Increased SMA risk  Info for partner testing given 1/27 '[x]'$  genetic counseling with MFM 01/20/20  ? Gestational diabetes mellitus (GDM) in third trimester controlled on oral hypoglycemic drug 05/18/2020  ? ? ?Past Surgical History:  ?Procedure Laterality Date  ? CESAREAN SECTION  05/19/2020  ? Procedure: CESAREAN SECTION;  Surgeon: Donnamae Jude, MD;  Location: [facility];  Service: Obstetrics;;  Arrest of Dilation, Fetal Tachycardia  ? WISDOM TOOTH EXTRACTION    ? ? ?Family History  ?Family history unknown: Yes  ? ? ?Social History  ? ?Socioeconomic History  ? Marital status: Single  ?  Spouse name: Not on file  ? Number of children: Not on file  ? Years of education: Not on file  ? Highest education level: Not on file   ?Occupational History  ? Not on file  ?Tobacco Use  ? Smoking status: Never  ?  Passive exposure: Yes  ? Smokeless tobacco: Never  ?Vaping Use  ? Vaping Use: Never used  ?Substance and Sexual Activity  ? Alcohol use: No  ? Drug use: Yes  ?  Types: Marijuana  ?  Comment: last used when found out pregnant  ? Sexual activity: Yes  ?  Birth control/protection: Patch, None  ?Other Topics Concern  ? Not on file  ?Social History Narrative  ? Not on file  ? ?Social Determinants of Health  ? ?Financial Resource Strain: Not on file  ?Food Insecurity: Not on file  ?Transportation Needs: Not on file  ?Physical Activity: Not on file  ?Stress: Not on file  ?Social Connections: Not on file  ?Intimate Partner Violence: Not on file  ? ? ?Outpatient Medications Prior to Visit  ?Medication Sig Dispense Refill  ? albuterol (PROVENTIL) (2.5 MG/3ML) 0.083% nebulizer solution Take 3 mLs (2.5 mg total) by nebulization every 6 (six) hours as needed for wheezing. 75 mL 1  ? albuterol (VENTOLIN HFA) 108 (90 Base) MCG/ACT inhaler Inhale 1-2 puffs into the lungs every 6 (six) hours as needed for wheezing or shortness of breath. 6.7 g 1  ? fluconazole (DIFLUCAN) 150 MG tablet Take 1 tablet (150 mg total) by mouth daily. 1 tablet 0  ? norelgestromin-ethinyl estradiol (ORTHO EVRA) 150-35 MCG/24HR transdermal  patch Place 1 patch onto the skin once a week. for 3 weeks. After that, the patch is taken off for 1 week. 12 patch 3  ? doxycycline (MONODOX) 100 MG capsule Take 100 mg by mouth 2 (two) times daily. (Patient not taking: Reported on 01/25/2022)    ? metroNIDAZOLE (FLAGYL) 500 MG tablet Take 1 tablet (500 mg total) by mouth 2 (two) times daily. (Patient not taking: Reported on 10/15/2021) 14 tablet 0  ? Prenatal Vit-Fe Fumarate-FA (PRENATAL VITAMIN) 27-0.8 MG TABS Take 1 tablet by mouth daily at 6 (six) AM. (Patient not taking: Reported on 10/15/2021) 30 tablet 0  ? ?No facility-administered medications prior to visit.  ? ? ?No Known  Allergies ? ?ROS ?Review of Systems ? ?  ?Objective:  ?  ?Physical Exam ?HENT:  ?   Head: Normocephalic and atraumatic.  ?Cardiovascular:  ?   Rate and Rhythm: Normal rate.  ?Pulmonary:  ?   Effort: Pulmonary effort is normal.  ?Musculoskeletal:     ?   General: Normal range of motion.  ?   Cervical back: Normal range of motion.  ?Skin: ?   General: Skin is warm.  ?   Capillary Refill: Capillary refill takes less than 2 seconds.  ?Neurological:  ?   General: No focal deficit present.  ?   Mental Status: She is alert.  ?Psychiatric:  ?   Comments: tearful  ? ? ?BP 117/74   Temp 98 ?F (36.7 ?C)   Ht '5\' 3"'$  (1.6 m)   Wt 170 lb (77.1 kg)   LMP 12/28/2021 (Approximate) Comment: birth control patch  SpO2 99%   BMI 30.11 kg/m?  ?Wt Readings from Last 3 Encounters:  ?01/25/22 170 lb (77.1 kg)  ?01/11/22 170 lb 3.2 oz (77.2 kg)  ?10/15/21 177 lb 12.8 oz (80.6 kg)  ? ? ? ?Health Maintenance Due  ?Topic Date Due  ? COVID-19 Vaccine (1) Never done  ? ? ?There are no preventive care reminders to display for this patient. ? ?No results found for: TSH ?Lab Results  ?Component Value Date  ? WBC 7.8 10/05/2021  ? HGB 12.4 10/05/2021  ? HCT 38.8 10/05/2021  ? MCV 84.2 10/05/2021  ? PLT 384 10/05/2021  ? ?Lab Results  ?Component Value Date  ? NA 134 (L) 10/05/2021  ? K 3.7 10/05/2021  ? CO2 25 10/05/2021  ? GLUCOSE 66 (L) 10/05/2021  ? BUN 14 10/05/2021  ? CREATININE 0.89 10/05/2021  ? BILITOT 0.8 07/09/2020  ? ALKPHOS 129 (H) 07/09/2020  ? AST 23 07/09/2020  ? ALT 34 07/09/2020  ? PROT 7.8 07/09/2020  ? ALBUMIN 3.6 07/09/2020  ? CALCIUM 9.4 10/05/2021  ? ANIONGAP 7 10/05/2021  ? ?No results found for: CHOL ?No results found for: HDL ?No results found for: [place] ?No results found for: TRIG ?No results found for: CHOLHDL ?Lab Results  ?Component Value Date  ? HGBA1C 5.2 10/27/2019  ? ? ?  ?Assessment & Plan:  ? ?Problem List Items Addressed This Visit   ?None ?Visit Diagnoses   ? ? Encounter for other contraceptive management     -  Primary  ? Relevant Orders  ? POCT URINALYSIS DIP (CLINITEK) (Completed)  ? POCT urine pregnancy (Completed)  ? hCG, quantitative, pregnancy  ? Positive pregnancy test     ?Brief counseling ?Education provided ?Encouraged starting Prenatal vitamin  ?Follow up with any questions  ? Relevant Orders  ? hCG, quantitative, pregnancy  ? hCG, quantitative, pregnancy  ? ?  ? ? ?  No orders of the defined types were placed in this encounter. ? ? ?Follow-up: Return in about 1 year (around 01/26/2023) for AND, follow up as needed.  ? ? ?Vevelyn Francois, NP ?

## 2022-01-26 LAB — HCG, SERUM, QUALITATIVE: hCG,Beta Subunit,Qual,Serum: POSITIVE m[IU]/mL — AB (ref <6)

## 2022-01-28 LAB — CHLAMYDIA/GONOCOCCUS/TRICHOMONAS, NAA
Chlamydia by NAA: NEGATIVE
Gonococcus by NAA: NEGATIVE
Trich vag by NAA: NEGATIVE

## 2022-03-27 DIAGNOSIS — Z113 Encounter for screening for infections with a predominantly sexual mode of transmission: Secondary | ICD-10-CM | POA: Diagnosis not present

## 2022-03-27 DIAGNOSIS — Z114 Encounter for screening for human immunodeficiency virus [HIV]: Secondary | ICD-10-CM | POA: Diagnosis not present

## 2022-03-27 DIAGNOSIS — Z0389 Encounter for observation for other suspected diseases and conditions ruled out: Secondary | ICD-10-CM | POA: Diagnosis not present

## 2022-04-02 DIAGNOSIS — A5600 Chlamydial infection of lower genitourinary tract, unspecified: Secondary | ICD-10-CM | POA: Diagnosis not present

## 2022-06-14 NOTE — Telephone Encounter (Signed)
Please schedule nurse visit for swab. Thanks.

## 2022-06-25 ENCOUNTER — Ambulatory Visit (INDEPENDENT_AMBULATORY_CARE_PROVIDER_SITE_OTHER): Payer: Medicaid Other | Admitting: Family Medicine

## 2022-06-25 DIAGNOSIS — J029 Acute pharyngitis, unspecified: Secondary | ICD-10-CM

## 2022-06-25 DIAGNOSIS — N760 Acute vaginitis: Secondary | ICD-10-CM | POA: Diagnosis not present

## 2022-06-25 DIAGNOSIS — B9689 Other specified bacterial agents as the cause of diseases classified elsewhere: Secondary | ICD-10-CM

## 2022-06-25 DIAGNOSIS — N898 Other specified noninflammatory disorders of vagina: Secondary | ICD-10-CM

## 2022-06-25 LAB — POCT RAPID STREP A (OFFICE): Rapid Strep A Screen: NEGATIVE

## 2022-06-25 MED ORDER — METRONIDAZOLE 1.3 % VA GEL: 1.0000 | Freq: Every day | VAGINAL | 0 refills | Status: AC

## 2022-06-25 NOTE — Progress Notes (Signed)
Patient [facility]   Acute Office Visit  Subjective:     Patient ID: Bill Mcvey, female    DOB: 01/10/2001, 21 y.o.   MRN: [id number]  No chief complaint on file.   Mikiala Fugett is a 21 year old female with medical history significant for asthma presents with complaints of vaginal odor and vaginal itching over the past several days.  Patient states that symptoms are similar to bacterial vaginosis that she has had in the past.  Patient attributes current vaginosis to changing her tampons.  She has a normal menstrual cycle.  She is sexually active with barrier protection.  No abdominal pain, vaginal discharge, dyspareunia, or fever.  Patient endorses some sore throat pain today.   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   HENT: Negative.    Eyes: Negative.   Respiratory: Negative.    Cardiovascular: Negative.   Genitourinary:  Negative for flank pain, hematuria and urgency.       Vaginal itching  Musculoskeletal: Negative.   Skin: Negative.   Neurological: Negative.   Psychiatric/Behavioral: Negative.          Objective:   BP Readings from Last 3 Encounters:  01/25/22 117/74  01/11/22 132/82  10/15/21 113/60   Wt Readings from Last 3 Encounters:  01/25/22 170 lb (77.1 kg)  01/11/22 170 lb 3.2 oz (77.2 kg)  10/15/21 177 lb 12.8 oz (80.6 kg)      Physical Exam Constitutional:      Appearance: Normal appearance.  Cardiovascular:     Rate and Rhythm: Normal rate.  Pulmonary:     Effort: Pulmonary effort is normal.  Abdominal:      General: Bowel sounds are normal.  Neurological:     Mental Status: Mental status is at baseline.     Results for orders placed or performed in visit on 06/25/22  POCT rapid strep A  Result Value Ref Range   Rapid Strep A Screen Negative Negative        Assessment & Plan:   Problem List Items Addressed This Visit   None Visit Diagnoses     Vaginal itching    -  Primary   Relevant Medications   metroNIDAZOLE 1.3 % GEL   Other Relevant Orders   NuSwab Vaginitis Plus (VG+)   POCT rapid strep A (Completed)   Sore throat       Relevant Orders   POCT rapid strep A (Completed)   Bacterial vaginitis       Relevant Medications   metroNIDAZOLE 1.3 % GEL       Meds ordered this encounter  Medications   metroNIDAZOLE 1.3 % GEL    Sig: Place 1 Application vaginally at bedtime for 5 days.    Dispense:  5 g    Refill:  0    Order Specific Question:   Supervising Provider    Answer:   Tresa Garter [[id number]]    Patient to follow-up with PCP as scheduled   Donia Pounds  APRN, MSN, FNP-C [facility]  [place], [place] [address] [phone]

## 2022-06-25 NOTE — Patient Instructions (Signed)
Monistat with boric acid Bacterial Vaginosis  Bacterial vaginosis is an infection of the vagina. It happens when too many normal germs (healthy bacteria) grow in the vagina. This infection can make it easier to get other infections from sex (STIs). It is very important for pregnant women to get treated. This infection can cause babies to be born early or at a low birth weight. What are the causes? This infection is caused by an increase in certain germs that grow in the vagina. You cannot get this infection from toilet seats, bedsheets, swimming pools, or things that touch your vagina. What increases the risk? Having sex with a new person or more than one person. Having sex without protection. Douching. Having an intrauterine device (IUD). Smoking. Using drugs or drinking alcohol. These can lead you to do things that are risky. Taking certain antibiotic medicines. Being pregnant. What are the signs or symptoms? Some women have no symptoms. Symptoms may include: A discharge from your vagina. It may be gray or white. It can be watery or foamy. A fishy smell. This can happen after sex or during your menstrual period. Itching in and around your vagina. A feeling of burning or pain when you pee (urinate). How is this treated? This infection is treated with antibiotic medicines. These may be given to you as: A pill. A cream for your vagina. A medicine that you put into your vagina (suppository). If the infection comes back after treatment, you may need more antibiotics. Follow these instructions at home: Medicines Take over-the-counter and prescription medicines as told by your doctor. Take or use your antibiotic medicine as told by your doctor. Do not stop taking or using it, even if you start to feel better. General instructions If the person you have sex with is a woman, tell her that you have this infection. She will need to follow up with her doctor. If you have a female partner, he  does not need to be treated. Do not have sex until you finish treatment. Drink enough fluid to keep your pee pale yellow. Keep your vagina and butt clean. Wash the area with warm water each day. Wipe from front to back after you use the toilet. If you are breastfeeding a baby, ask your doctor if you should keep doing so during treatment. Keep all follow-up visits. How is this prevented? Self-care Do not douche. Use only warm water to wash around your vagina. Wear underwear that is cotton or lined with cotton. Do not wear tight pants and pantyhose, especially in the summer. Safe sex Use protection when you have sex. This includes: Use condoms. Use dental dams. This is a thin layer that protects the mouth during oral sex. Limit how many people you have sex with. To prevent this infection, it is best to have sex with just one person. Get tested for STIs. The person you have sex with should also get tested. Drugs and alcohol Do not smoke or use any products that contain nicotine or tobacco. If you need help quitting, ask your doctor. Do not use drugs. Do not drink alcohol if: Your doctor tells you not to drink. You are pregnant, may be pregnant, or are planning to become pregnant. If you drink alcohol: Limit how much you have to 0-1 drink a day. Know how much alcohol is in your drink. In the [place] one drink equals one 12 oz bottle of beer (355 mL), one 5 oz glass of wine (148 mL), or one 1 oz glass  of hard liquor (44 mL). Where to find more information Centers for Disease Control and Prevention: http://www.wolf.info/ American Sexual Health Association: www.ashastd.org Office on [employer] Health: [place] Contact a doctor if: Your symptoms do not get better, even after you are treated. You have more discharge or pain when you pee. You have a fever or chills. You have pain in your belly (abdomen) or in the area between your hips. You have pain with sex. You bleed from your vagina  between menstrual periods. Summary This infection can happen when too many germs (bacteria) grow in the vagina. This infection can make it easier to get infections from sex (STIs). Treating this can lower that chance. Get treated if you are pregnant. This infection can cause babies to be born early. Do not stop taking or using your antibiotic medicine, even if you start to feel better. This information is not intended to replace advice given to you by your health care provider. Make sure you discuss any questions you have with your health care provider. Document Revised: 04/27/2020 Document Reviewed: 04/27/2020 Elsevier Patient Education  [place].

## 2022-06-25 NOTE — Telephone Encounter (Signed)
Patient came in for vaginal swab today

## 2022-06-28 ENCOUNTER — Other Ambulatory Visit: Payer: Self-pay | Admitting: Nurse Practitioner

## 2022-06-28 LAB — NUSWAB VAGINITIS PLUS (VG+)
Candida albicans, NAA: POSITIVE — AB
Candida glabrata, NAA: NEGATIVE
Chlamydia trachomatis, NAA: NEGATIVE
Neisseria gonorrhoeae, NAA: NEGATIVE
Trich vag by NAA: NEGATIVE

## 2022-06-28 MED ORDER — FLUCONAZOLE 150 MG PO TABS: 150.0000 mg | ORAL_TABLET | Freq: Every day | ORAL | 0 refills | Status: DC

## 2022-06-30 DIAGNOSIS — H109 Unspecified conjunctivitis: Secondary | ICD-10-CM | POA: Diagnosis not present

## 2022-06-30 DIAGNOSIS — J309 Allergic rhinitis, unspecified: Secondary | ICD-10-CM | POA: Diagnosis not present

## 2022-09-16 ENCOUNTER — Inpatient Hospital Stay (HOSPITAL_COMMUNITY): Payer: Medicaid Other

## 2022-09-16 ENCOUNTER — Encounter (HOSPITAL_COMMUNITY): Payer: Self-pay | Admitting: Obstetrics and Gynecology

## 2022-09-16 ENCOUNTER — Inpatient Hospital Stay (HOSPITAL_COMMUNITY)
Admission: AD | Admit: 2022-09-16 | Discharge: 2022-09-17 | Disposition: A | Discharge status: Home / Self Care | Payer: Medicaid Other | Attending: Obstetrics and Gynecology | Admitting: Obstetrics and Gynecology

## 2022-09-16 DIAGNOSIS — Z3A01 Less than 8 weeks gestation of pregnancy: Secondary | ICD-10-CM | POA: Diagnosis not present

## 2022-09-16 DIAGNOSIS — Z3490 Encounter for supervision of normal pregnancy, unspecified, unspecified trimester: Secondary | ICD-10-CM

## 2022-09-16 DIAGNOSIS — O208 Other hemorrhage in early pregnancy: Secondary | ICD-10-CM | POA: Diagnosis not present

## 2022-09-16 DIAGNOSIS — R109 Unspecified abdominal pain: Secondary | ICD-10-CM | POA: Diagnosis not present

## 2022-09-16 DIAGNOSIS — O418X10 Other specified disorders of amniotic fluid and membranes, first trimester, not applicable or unspecified: Secondary | ICD-10-CM | POA: Diagnosis not present

## 2022-09-16 DIAGNOSIS — O26891 Other specified pregnancy related conditions, first trimester: Secondary | ICD-10-CM | POA: Diagnosis not present

## 2022-09-16 LAB — CBC
HCT: 33.9 % — ABNORMAL LOW (ref 36.0–46.0)
Hemoglobin: 11.7 g/dL — ABNORMAL LOW (ref 12.0–15.0)
MCH: 28 pg (ref 26.0–34.0)
MCHC: 34.5 g/dL (ref 30.0–36.0)
MCV: 81.1 fL (ref 80.0–100.0)
Platelets: 280 10*3/uL (ref 150–400)
RBC: 4.18 MIL/uL (ref 3.87–5.11)
RDW: 16.3 % — ABNORMAL HIGH (ref 11.5–15.5)
WBC: 7.5 10*3/uL (ref 4.0–10.5)
nRBC: 0 % (ref 0.0–0.2)

## 2022-09-16 LAB — URINALYSIS, ROUTINE W REFLEX MICROSCOPIC
Bilirubin Urine: NEGATIVE
Glucose, UA: NEGATIVE mg/dL
Ketones, ur: NEGATIVE mg/dL
Leukocytes,Ua: NEGATIVE
Nitrite: NEGATIVE
Protein, ur: NEGATIVE mg/dL
Specific Gravity, Urine: 1.026 (ref 1.005–1.030)
pH: 6 (ref 5.0–8.0)

## 2022-09-16 LAB — WET PREP, GENITAL
Clue Cells Wet Prep HPF POC: NONE SEEN
Sperm: NONE SEEN
Trich, Wet Prep: NONE SEEN
WBC, Wet Prep HPF POC: >=10 — AB (ref <10)
Yeast Wet Prep HPF POC: NONE SEEN

## 2022-09-16 LAB — POCT PREGNANCY, URINE: Preg Test, Ur: POSITIVE — AB

## 2022-09-16 MED ORDER — PREPLUS 27-1 MG PO TABS: 1.0000 | ORAL_TABLET | Freq: Every day | ORAL | 13 refills | Status: DC

## 2022-09-16 NOTE — Discharge Instructions (Signed)

## 2022-09-16 NOTE — MAU Note (Signed)
Pt says she did 4 HPT in Aug - positive  Says started VB - went to b-room at 8 pm- saw on her underwear - brown . Felt  cramps - started yesterday - pain - 4  Took XS Tyle 1 tab yesterday -  Then cramps all day - took another Tyl at 10, and 4pm- pain  now is 0 .

## 2022-09-17 DIAGNOSIS — O418X10 Other specified disorders of amniotic fluid and membranes, first trimester, not applicable or unspecified: Secondary | ICD-10-CM | POA: Diagnosis not present

## 2022-09-17 LAB — GC/CHLAMYDIA PROBE AMP (~~LOC~~) NOT AT ARMC
Chlamydia: NEGATIVE
Comment: NEGATIVE
Comment: NORMAL
Neisseria Gonorrhea: NEGATIVE

## 2022-09-17 LAB — HCG, QUANTITATIVE, PREGNANCY: hCG, Beta Chain, Quant, S: 175430 m[IU]/mL — ABNORMAL HIGH (ref <5)

## 2022-09-17 NOTE — MAU Provider Note (Signed)
History     CSN: [id number]  Arrival date and time: 09/16/22 2225   Event Date/Time   First Provider Initiated Contact with Patient 09/16/22 2354      Chief Complaint  Patient presents with   Abdominal Cramping   Susan Moore is a 21 y.o. G3P1011 at [place] 1d by LMP. She presents to MAU with chief complaint of abdominal cramping. This is a new problem, onset yesterday 09/15/2022. Pain score is 4/10. She denies dysuria, flank pain, fever or recent illness.  Patient also reports brown-tinged vaginal spotting. It's "enough that I have to wear a pad" but she is not filling the pads. She denies dizziness, weakness, syncope. She is remote from sexual intercourse.   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   + Alpha Thalassemia carrier Increased SMA risk  Info for partner testing given 1/27 '[x]'$  genetic counseling with MFM 01/20/20   Gestational diabetes mellitus (GDM) in third trimester controlled on oral hypoglycemic drug 05/18/2020    Past Surgical History:  Procedure Laterality Date   CESAREAN SECTION  05/19/2020   Procedure: CESAREAN SECTION;  Surgeon: Donnamae Jude, MD;  Location: [facility];  Service: Obstetrics;;  Arrest of Dilation, Fetal Tachycardia   WISDOM TOOTH EXTRACTION      Family History  Family history unknown: Yes    Social History   Tobacco Use   Smoking status: Never    Passive exposure: Yes   Smokeless tobacco: Never  Vaping Use   Vaping Use: Never used  Substance Use Topics   Alcohol use: No   Drug use: Yes    Types: Marijuana    Comment: last used when found out pregnant    Allergies: No Known Allergies  Medications Prior to Admission  Medication Sig  Dispense Refill Last Dose   albuterol (PROVENTIL) (2.5 MG/3ML) 0.083% nebulizer solution Take 3 mLs (2.5 mg total) by nebulization every 6 (six) hours as needed for wheezing. 75 mL 1 09/15/2022   albuterol (VENTOLIN HFA) 108 (90 Base) MCG/ACT inhaler Inhale 1-2 puffs into the lungs every 6 (six) hours as needed for wheezing or shortness of breath. 6.7 g 1    doxycycline (MONODOX) 100 MG capsule Take 100 mg by mouth 2 (two) times daily. (Patient not taking: Reported on 01/25/2022)      fluconazole (DIFLUCAN) 150 MG tablet Take 1 tablet (150 mg total) by mouth daily. 1 tablet 0 More than a month   norelgestromin-ethinyl estradiol (ORTHO EVRA) 150-35 MCG/24HR transdermal patch Place 1 patch onto the skin once a week. for 3 weeks. After that, the patch is taken off for 1 week. 12 patch 3    Prenatal Vit-Fe Fumarate-FA (PRENATAL VITAMIN) 27-0.8 MG TABS Take 1 tablet by mouth daily at 6 (six) AM. (Patient not taking: Reported on 10/15/2021) 30 tablet 0     Review of Systems  Gastrointestinal:  Positive for abdominal pain.  Genitourinary:  Positive for vaginal  bleeding.  All other systems reviewed and are negative.  Physical Exam   Blood pressure (!) 130/52, pulse 81, temperature 98.3 F (36.8 C), temperature source Oral, resp. rate 16, height '5\' 3"'$  (1.6 m), weight 80 kg, last menstrual period 06/30/2022.  Physical Exam Vitals and nursing note reviewed. Exam conducted with a chaperone present.  Constitutional:      Appearance: Normal appearance. She is not ill-appearing.  Cardiovascular:     Rate and Rhythm: Normal rate.  Pulmonary:     Effort: Pulmonary effort is normal.  Abdominal:     General: Abdomen is flat.  Skin:    Capillary Refill: Capillary refill takes less than 2 seconds.  Neurological:     Mental Status: She is alert and oriented to person, place, and time.  Psychiatric:        Mood and Affect: Mood normal.        Behavior: Behavior normal.        Thought Content: Thought  content normal.        Judgment: Judgment normal.     MAU Course  Procedures  MDM Orders Placed This Encounter  Procedures   Wet prep, genital   US OB Comp Less 14 Wks   CBC   hCG, quantitative, pregnancy   Urinalysis, Routine w reflex microscopic Urine, Clean Catch   Pregnancy, urine POC   Discharge patient   Patient Vitals for the past 24 hrs:  BP Temp Temp src Pulse Resp Height Weight  09/17/22 0002 -- -- -- -- 16 -- --  09/16/22 2249 (!) 130/52 98.3 F (36.8 C) Oral 81 16 '5\' 3"'$  (1.6 m) 80 kg   Results for orders placed or performed during the hospital encounter of 09/16/22 (from the past 24 hour(s))  Pregnancy, urine POC     Status: Abnormal   Collection Time: 09/16/22 10:49 PM  Result Value Ref Range   Preg Test, Ur POSITIVE (A) NEGATIVE  Urinalysis, Routine w reflex microscopic     Status: Abnormal   Collection Time: 09/16/22 11:03 PM  Result Value Ref Range   Color, Urine YELLOW YELLOW   APPearance HAZY (A) CLEAR   Specific Gravity, Urine 1.026 1.005 - 1.030   pH 6.0 5.0 - 8.0   Glucose, UA NEGATIVE NEGATIVE mg/dL   Hgb urine dipstick LARGE (A) NEGATIVE   Bilirubin Urine NEGATIVE NEGATIVE   Ketones, ur NEGATIVE NEGATIVE mg/dL   Protein, ur NEGATIVE NEGATIVE mg/dL   Nitrite NEGATIVE NEGATIVE   Leukocytes,Ua NEGATIVE NEGATIVE   RBC / HPF 0-5 0 - 5 RBC/hpf   WBC, UA 0-5 0 - 5 WBC/hpf   Bacteria, UA RARE (A) NONE SEEN   Squamous Epithelial / LPF 0-5 0 - 5   Mucus PRESENT   Wet prep, genital     Status: Abnormal   Collection Time: 09/16/22 11:24 PM  Result Value Ref Range   Yeast Wet Prep HPF POC NONE SEEN NONE SEEN   Trich, Wet Prep NONE SEEN NONE SEEN   Clue Cells Wet Prep HPF POC NONE SEEN NONE SEEN   WBC, Wet Prep HPF POC >=10 (A) <10   Sperm NONE SEEN   CBC     Status: Abnormal   Collection Time: 09/16/22 11:26 PM  Result Value Ref Range   WBC 7.5 4.0 - 10.5 K/uL   RBC 4.18 3.87 - 5.11 MIL/uL   Hemoglobin 11.7 (L) 12.0 - 15.0 g/dL   HCT 33.9 (L)  36.0 - 46.0 %   MCV  81.1 80.0 - 100.0 fL   MCH 28.0 26.0 - 34.0 pg   MCHC 34.5 30.0 - 36.0 g/dL   RDW 16.3 (H) 11.5 - 15.5 %   Platelets 280 150 - 400 K/uL   nRBC 0.0 0.0 - 0.2 %   US OB Comp Less 14 Wks  Result Date: 09/16/2022 CLINICAL DATA:  Vaginal bleeding. EXAM: OBSTETRIC <14 WK ULTRASOUND TECHNIQUE: Transabdominal ultrasound was performed for evaluation of the gestation as well as the maternal uterus and adnexal regions. COMPARISON:  October 05, 2021 FINDINGS: Intrauterine gestational sac: Single Yolk sac:  Visualized. Embryo:  Visualized. Cardiac Activity: Visualized. Heart Rate: 164 bpm CRL:   15.3 mm   7 w 6 d                  [place] EDC: April 29, 2023 Subchorionic hemorrhage:  Small (1.57 cm x 0.95 cm) Maternal uterus/adnexae: The right ovary is not visualized. The left ovary measures 1.8 cm x 1.3 cm x 1.5 cm and is normal in appearance. No pelvic free fluid is noted. IMPRESSION: 1. Single, viable intrauterine pregnancy at approximately 7 weeks and 6 days gestation by ultrasound evaluation. 2. Small subchorionic hemorrhage. Electronically Signed   By: Virgina Norfolk M.D.   On: 09/16/2022 23:50    Meds ordered this encounter  Medications   Prenatal Vit-Fe Fumarate-FA (PREPLUS) 27-1 MG TABS    Sig: Take 1 tablet by mouth daily.    Dispense:  30 tablet    Refill:  13    Order Specific Question:   Supervising Provider    Answer:   Griffin Basil [[id number]]   Assessment and Plan  --21 y.o. [phone] with SIUP measuring [redacted]w[redacted]d--Subchorionic hematoma, pelvic rest advised --Blood type O POS - G/C swab in work --Discharge home in stable condition with first trimester precautions  SDarlina Rumpf MKenmore MSN, CNM 09/17/2022, 12:57 AM

## 2022-09-25 ENCOUNTER — Other Ambulatory Visit: Payer: Self-pay | Admitting: Nurse Practitioner

## 2022-09-25 DIAGNOSIS — J4520 Mild intermittent asthma, uncomplicated: Secondary | ICD-10-CM

## 2022-10-01 ENCOUNTER — Telehealth: Payer: Medicaid Other | Admitting: Physician Assistant

## 2022-10-01 DIAGNOSIS — J4521 Mild intermittent asthma with (acute) exacerbation: Secondary | ICD-10-CM | POA: Diagnosis not present

## 2022-10-01 MED ORDER — PROAIR HFA 108 (90 BASE) MCG/ACT IN AERS: INHALATION_SPRAY | RESPIRATORY_TRACT | 1 refills | Status: DC

## 2022-10-01 MED ORDER — BENZONATATE 100 MG PO CAPS: 100.0000 mg | ORAL_CAPSULE | Freq: Three times a day (TID) | ORAL | 0 refills | Status: DC | PRN

## 2022-10-01 MED ORDER — AMOXICILLIN 500 MG PO TABS: 500.0000 mg | ORAL_TABLET | Freq: Two times a day (BID) | ORAL | 0 refills | Status: DC

## 2022-10-01 MED ORDER — ALBUTEROL SULFATE (2.5 MG/3ML) 0.083% IN NEBU: 2.5000 mg | INHALATION_SOLUTION | Freq: Four times a day (QID) | RESPIRATORY_TRACT | 0 refills | Status: AC | PRN

## 2022-10-01 NOTE — Patient Instructions (Signed)
[name], thank you for joining Leeanne Rio, PA-C for today's virtual visit.  While this provider is not your primary care provider (PCP), if your PCP is located in our provider database this encounter information will be shared with them immediately following your visit.   [place] account gives you access to today's visit and all your visits, tests, and labs performed at [facility] " click here if you don't have a [facility] account or go to [email or web address]  Consent: (Patient) Susan Moore provided verbal consent for this virtual visit at the beginning of the encounter.  Current Medications:  Current Outpatient Medications:    amoxicillin (AMOXIL) 500 MG tablet, Take 1 tablet (500 mg total) by mouth 2 (two) times daily., Disp: 20 tablet, Rfl: 0   benzonatate (TESSALON) 100 MG capsule, Take 1 capsule (100 mg total) by mouth 3 (three) times daily as needed for cough., Disp: 30 capsule, Rfl: 0   albuterol (PROVENTIL) (2.5 MG/3ML) 0.083% nebulizer solution, Take 3 mLs (2.5 mg total) by nebulization every 6 (six) hours as needed for wheezing., Disp: 75 mL, Rfl: 0   Prenatal Vit-Fe Fumarate-FA (PREPLUS) 27-1 MG TABS, Take 1 tablet by mouth daily., Disp: 30 tablet, Rfl: 13   PROAIR HFA 108 (90 Base) MCG/ACT inhaler, INHALE 1 TO 2 PUFFS INTO THE LUNGS EVERY 6 HOURS AS NEEDED FOR WHEEZING OR SHORTNESS OF BREATH, Disp: 8.5 g, Rfl: 1   Medications ordered in this encounter:  Meds ordered this encounter  Medications   PROAIR HFA 108 (90 Base) MCG/ACT inhaler    Sig: INHALE 1 TO 2 PUFFS INTO THE LUNGS EVERY 6 HOURS AS NEEDED FOR WHEEZING OR SHORTNESS OF BREATH    Dispense:  8.5 g    Refill:  1    Order Specific Question:   Supervising Provider    Answer:   Chase Picket [[id number]]   amoxicillin (AMOXIL) 500 MG tablet    Sig: Take 1 tablet (500 mg total) by mouth 2 (two) times daily.    Dispense:  20 tablet    Refill:  0    Order  Specific Question:   Supervising Provider    Answer:   Chase Picket [[id number]]   benzonatate (TESSALON) 100 MG capsule    Sig: Take 1 capsule (100 mg total) by mouth 3 (three) times daily as needed for cough.    Dispense:  30 capsule    Refill:  0    Order Specific Question:   Supervising Provider    Answer:   Chase Picket [[id number]]   albuterol (PROVENTIL) (2.5 MG/3ML) 0.083% nebulizer solution    Sig: Take 3 mLs (2.5 mg total) by nebulization every 6 (six) hours as needed for wheezing.    Dispense:  75 mL    Refill:  0    Order Specific Question:   Supervising Provider    Answer:   Chase Picket [[id number]]     *If you need refills on other medications prior to your next appointment, please contact your pharmacy*  Follow-Up: Call back or seek an in-person evaluation if the symptoms worsen or if the condition fails to improve as anticipated.  [place] [phone]  Other Instructions Take antibiotic (Amoxicillin) as directed.  Increase fluids.  Get plenty of rest. Use Mucinex for congestion. Restat your breathing medications. Take a daily probiotic (I recommend Align or Culturelle, but even Activia Yogurt may be beneficial).  A humidifier placed in the  bedroom may offer some relief for a dry, scratchy throat of nasal irritation.  Read information below on acute bronchitis. Please call or return to clinic if symptoms are not improving.  Acute Bronchitis Bronchitis is when the airways that extend from the windpipe into the lungs get red, puffy, and painful (inflamed). Bronchitis often causes thick spit (mucus) to develop. This leads to a cough. A cough is the most common symptom of bronchitis. In acute bronchitis, the condition usually begins suddenly and goes away over time (usually in 2 weeks). Smoking, allergies, and asthma can make bronchitis worse. Repeated episodes of bronchitis may cause more lung problems.  HOME CARE Rest. Drink enough fluids to keep  your pee (urine) clear or pale yellow (unless you need to limit fluids as told by your doctor). Only take over-the-counter or prescription medicines as told by your doctor. Avoid smoking and secondhand smoke. These can make bronchitis worse. If you are a smoker, think about using nicotine gum or skin patches. Quitting smoking will help your lungs heal faster. Reduce the chance of getting bronchitis again by: Washing your hands often. Avoiding people with cold symptoms. Trying not to touch your hands to your mouth, nose, or eyes. Follow up with your doctor as told.  GET HELP IF: Your symptoms do not improve after 1 week of treatment. Symptoms include: Cough. Fever. Coughing up thick spit. Body aches. Chest congestion. Chills. Shortness of breath. Sore throat.  GET HELP RIGHT AWAY IF:  You have an increased fever. You have chills. You have severe shortness of breath. You have bloody thick spit (sputum). You throw up (vomit) often. You lose too much body fluid (dehydration). You have a severe headache. You faint.  MAKE SURE YOU:  Understand these instructions. Will watch your condition. Will get help right away if you are not doing well or get worse. Document Released: 04/15/2008 Document Revised: 06/30/2013 Document Reviewed: 04/20/2013 [facility] Patient Information 2015 [place], [place]. This information is not intended to replace advice given to you by your health care provider. Make sure you discuss any questions you have with your health care provider.    If you have been instructed to have an in-person evaluation today at a local Urgent Care facility, please use the link below. It will take you to a list of all of our available [facility], including address, phone number and hours of operation. Please do not delay care.  [facility]  If you or a family member do not have a primary care provider, use the link below to schedule a visit and establish  care. When you choose a [facility] primary care physician or advanced practice provider, you gain a long-term partner in health. Find a Primary Care Provider  Learn more about 's in-office and virtual care options: [facility]

## 2022-10-01 NOTE — Progress Notes (Signed)
Virtual Visit Consent   Susan Moore, you are scheduled for a virtual visit with a [facility] provider today. Just as with appointments in the office, your consent must be obtained to participate. Your consent will be active for this visit and any virtual visit you may have with one of our providers in the next 365 days. If you have a MyChart account, a copy of this consent can be sent to you electronically.  As this is a virtual visit, video technology does not allow for your provider to perform a traditional examination. This may limit your provider's ability to fully assess your condition. If your provider identifies any concerns that need to be evaluated in person or the need to arrange testing (such as labs, EKG, etc.), we will make arrangements to do so. Although advances in technology are sophisticated, we cannot ensure that it will always work on either your end or our end. If the connection with a video visit is poor, the visit may have to be switched to a telephone visit. With either a video or telephone visit, we are not always able to ensure that we have a secure connection.  By engaging in this virtual visit, you consent to the provision of healthcare and authorize for your insurance to be billed (if applicable) for the services provided during this visit. Depending on your insurance coverage, you may receive a charge related to this service.  I need to obtain your verbal consent now. Are you willing to proceed with your visit today? Susan Moore has provided verbal consent on 10/01/2022 for a virtual visit (video or telephone). Leeanne Rio, [place]  Date: 10/01/2022 3:24 PM  Virtual Visit via Video Note   I, Leeanne Rio, connected with  Susan Moore  ([id number], 07-Apr-2001) on 10/01/22 at  3:15 PM EST by a video-enabled telemedicine application and verified that I am speaking with the correct person using two identifiers.  Location: Patient: Virtual Visit  Location Patient: Home Provider: Virtual Visit Location Provider: Home Office   I discussed the limitations of evaluation and management by telemedicine and the availability of in person appointments. The patient expressed understanding and agreed to proceed.    History of Present Illness: Susan Moore is a 21 y.o. who identifies as a female who was assigned female at birth, and is being seen today for medication refill. Is out of her albuterol inhaler and has been for a bit. Typically only using rarely but over past week or so has been dealing with URI symptoms that are triggering her asthma. Requested refill from PCP on 16th but has not gotten a response. She does not want breathing to worsen. Denies fever, chills. Significant increase in congestion with cough now productive of thick, green sputum. Denies chest pain.  HPI: HPI  Problems:  Patient Active Problem List   Diagnosis Date Noted   Acute mastitis 07/12/2020   Asthma     Allergies: No Known Allergies Medications:  Current Outpatient Medications:    amoxicillin (AMOXIL) 500 MG tablet, Take 1 tablet (500 mg total) by mouth 2 (two) times daily., Disp: 20 tablet, Rfl: 0   benzonatate (TESSALON) 100 MG capsule, Take 1 capsule (100 mg total) by mouth 3 (three) times daily as needed for cough., Disp: 30 capsule, Rfl: 0   albuterol (PROVENTIL) (2.5 MG/3ML) 0.083% nebulizer solution, Take 3 mLs (2.5 mg total) by nebulization every 6 (six) hours as needed for wheezing., Disp: 75 mL, Rfl: 0   Prenatal Vit-Fe Fumarate-FA (  PREPLUS) 27-1 MG TABS, Take 1 tablet by mouth daily., Disp: 30 tablet, Rfl: 13   PROAIR HFA 108 (90 Base) MCG/ACT inhaler, INHALE 1 TO 2 PUFFS INTO THE LUNGS EVERY 6 HOURS AS NEEDED FOR WHEEZING OR SHORTNESS OF BREATH, Disp: 8.5 g, Rfl: 1  Observations/Objective: Patient is well-developed, well-nourished in no acute distress.  Resting comfortably at home.  Head is normocephalic, atraumatic.  No labored  breathing. Speech is clear and coherent with logical content.  Patient is alert and oriented at baseline.   Assessment and Plan: 1. Mild intermittent asthma with acute exacerbation - PROAIR HFA 108 (90 Base) MCG/ACT inhaler; INHALE 1 TO 2 PUFFS INTO THE LUNGS EVERY 6 HOURS AS NEEDED FOR WHEEZING OR SHORTNESS OF BREATH  Dispense: 8.5 g; Refill: 1 - amoxicillin (AMOXIL) 500 MG tablet; Take 1 tablet (500 mg total) by mouth 2 (two) times daily.  Dispense: 20 tablet; Refill: 0 - benzonatate (TESSALON) 100 MG capsule; Take 1 capsule (100 mg total) by mouth 3 (three) times daily as needed for cough.  Dispense: 30 capsule; Refill: 0 - albuterol (PROVENTIL) (2.5 MG/3ML) 0.083% nebulizer solution; Take 3 mLs (2.5 mg total) by nebulization every 6 (six) hours as needed for wheezing.  Dispense: 75 mL; Refill: 0  Inhaler and neb solution refilled as she has been out. Increase fluids. Supportive measures and OTC medications reviewed. Tessalon per orders. Giving change in sputum and concern for secondary bac bronchitis, will add on amoxicillin BID.   Follow Up Instructions: I discussed the assessment and treatment plan with the patient. The patient was provided an opportunity to ask questions and all were answered. The patient agreed with the plan and demonstrated an understanding of the instructions.  A copy of instructions were sent to the patient via MyChart unless otherwise noted below.   The patient was advised to call back or seek an in-person evaluation if the symptoms worsen or if the condition fails to improve as anticipated.  Time:  I spent 10 minutes with the patient via telehealth technology discussing the above problems/concerns.    Leeanne Rio, PA-C

## 2022-10-07 ENCOUNTER — Other Ambulatory Visit: Payer: Self-pay | Admitting: Nurse Practitioner

## 2022-10-07 DIAGNOSIS — J4521 Mild intermittent asthma with (acute) exacerbation: Secondary | ICD-10-CM

## 2022-10-07 MED ORDER — PROAIR HFA 108 (90 BASE) MCG/ACT IN AERS: INHALATION_SPRAY | RESPIRATORY_TRACT | 1 refills | Status: AC

## 2022-11-19 DIAGNOSIS — J4530 Mild persistent asthma, uncomplicated: Secondary | ICD-10-CM | POA: Diagnosis not present

## 2022-11-19 DIAGNOSIS — N771 Vaginitis, vulvitis and vulvovaginitis in diseases classified elsewhere: Secondary | ICD-10-CM | POA: Diagnosis not present

## 2022-11-19 DIAGNOSIS — J309 Allergic rhinitis, unspecified: Secondary | ICD-10-CM | POA: Diagnosis not present

## 2022-11-20 DIAGNOSIS — Z114 Encounter for screening for human immunodeficiency virus [HIV]: Secondary | ICD-10-CM | POA: Diagnosis not present

## 2022-11-20 DIAGNOSIS — Z113 Encounter for screening for infections with a predominantly sexual mode of transmission: Secondary | ICD-10-CM | POA: Diagnosis not present

## 2022-11-20 DIAGNOSIS — N760 Acute vaginitis: Secondary | ICD-10-CM | POA: Diagnosis not present

## 2023-01-09 DIAGNOSIS — Z1331 Encounter for screening for depression: Secondary | ICD-10-CM | POA: Diagnosis not present

## 2023-01-09 DIAGNOSIS — Z1151 Encounter for screening for human papillomavirus (HPV): Secondary | ICD-10-CM | POA: Diagnosis not present

## 2023-01-09 DIAGNOSIS — Z0000 Encounter for general adult medical examination without abnormal findings: Secondary | ICD-10-CM | POA: Diagnosis not present

## 2023-01-19 DIAGNOSIS — H5213 Myopia, bilateral: Secondary | ICD-10-CM | POA: Diagnosis not present

## 2023-02-19 ENCOUNTER — Other Ambulatory Visit: Payer: Self-pay | Admitting: Nurse Practitioner

## 2023-02-19 NOTE — Telephone Encounter (Signed)
Please advise KH 

## 2023-04-19 ENCOUNTER — Other Ambulatory Visit: Payer: Self-pay | Admitting: Nurse Practitioner

## 2023-08-03 ENCOUNTER — Other Ambulatory Visit: Payer: Self-pay | Admitting: Nurse Practitioner

## 2023-08-07 DIAGNOSIS — Z789 Other specified health status: Secondary | ICD-10-CM | POA: Diagnosis not present

## 2023-10-21 DIAGNOSIS — Z789 Other specified health status: Secondary | ICD-10-CM | POA: Diagnosis not present

## 2023-10-21 DIAGNOSIS — Z7251 High risk heterosexual behavior: Secondary | ICD-10-CM | POA: Diagnosis not present

## 2023-10-21 DIAGNOSIS — Z114 Encounter for screening for human immunodeficiency virus [HIV]: Secondary | ICD-10-CM | POA: Diagnosis not present

## 2024-01-12 ENCOUNTER — Encounter (HOSPITAL_COMMUNITY): Payer: Self-pay

## 2024-01-12 ENCOUNTER — Emergency Department (HOSPITAL_COMMUNITY)
Admission: EM | Admit: 2024-01-12 | Discharge: 2024-01-13 | Discharge status: Against Medical Advice | Attending: Emergency Medicine | Admitting: Emergency Medicine

## 2024-01-12 DIAGNOSIS — Z5321 Procedure and treatment not carried out due to patient leaving prior to being seen by health care provider: Secondary | ICD-10-CM | POA: Diagnosis not present

## 2024-01-12 DIAGNOSIS — R1110 Vomiting, unspecified: Secondary | ICD-10-CM | POA: Insufficient documentation

## 2024-01-12 NOTE — ED Triage Notes (Signed)
 Pt reports was eating streak tonight, and felt like it got stuck in her throat.Pt attempted to vomit with no relief, pt able to drink water. Pt speaking in full sentences, in triage, RR even and unlabored, NAD noted at time of triage

## 2024-01-20 DIAGNOSIS — E669 Obesity, unspecified: Secondary | ICD-10-CM | POA: Diagnosis not present

## 2024-01-20 DIAGNOSIS — E559 Vitamin D deficiency, unspecified: Secondary | ICD-10-CM | POA: Diagnosis not present

## 2024-01-20 DIAGNOSIS — J4530 Mild persistent asthma, uncomplicated: Secondary | ICD-10-CM | POA: Diagnosis not present

## 2024-01-20 DIAGNOSIS — Z0000 Encounter for general adult medical examination without abnormal findings: Secondary | ICD-10-CM | POA: Diagnosis not present

## 2024-01-20 DIAGNOSIS — Z1331 Encounter for screening for depression: Secondary | ICD-10-CM | POA: Diagnosis not present

## 2024-01-20 DIAGNOSIS — D509 Iron deficiency anemia, unspecified: Secondary | ICD-10-CM | POA: Diagnosis not present

## 2024-01-20 DIAGNOSIS — Z713 Dietary counseling and surveillance: Secondary | ICD-10-CM | POA: Diagnosis not present

## 2024-01-20 DIAGNOSIS — R002 Palpitations: Secondary | ICD-10-CM | POA: Diagnosis not present

## 2024-01-20 DIAGNOSIS — Z6831 Body mass index (BMI) 31.0-31.9, adult: Secondary | ICD-10-CM | POA: Diagnosis not present

## 2024-02-14 DIAGNOSIS — K529 Noninfective gastroenteritis and colitis, unspecified: Secondary | ICD-10-CM | POA: Diagnosis not present

## 2024-03-09 ENCOUNTER — Other Ambulatory Visit: Payer: Self-pay | Admitting: Nurse Practitioner

## 2024-03-09 DIAGNOSIS — J4521 Mild intermittent asthma with (acute) exacerbation: Secondary | ICD-10-CM

## 2024-03-16 DIAGNOSIS — R0602 Shortness of breath: Secondary | ICD-10-CM | POA: Diagnosis not present

## 2024-03-16 DIAGNOSIS — J4520 Mild intermittent asthma, uncomplicated: Secondary | ICD-10-CM | POA: Diagnosis not present

## 2024-03-20 DIAGNOSIS — E559 Vitamin D deficiency, unspecified: Secondary | ICD-10-CM | POA: Diagnosis not present

## 2024-03-20 DIAGNOSIS — Z0000 Encounter for general adult medical examination without abnormal findings: Secondary | ICD-10-CM | POA: Diagnosis not present

## 2024-03-20 DIAGNOSIS — Z1159 Encounter for screening for other viral diseases: Secondary | ICD-10-CM | POA: Diagnosis not present

## 2024-03-20 DIAGNOSIS — J4520 Mild intermittent asthma, uncomplicated: Secondary | ICD-10-CM | POA: Diagnosis not present

## 2024-03-20 DIAGNOSIS — Z7251 High risk heterosexual behavior: Secondary | ICD-10-CM | POA: Diagnosis not present

## 2024-03-20 DIAGNOSIS — Z1331 Encounter for screening for depression: Secondary | ICD-10-CM | POA: Diagnosis not present

## 2024-04-06 DIAGNOSIS — J4520 Mild intermittent asthma, uncomplicated: Secondary | ICD-10-CM | POA: Diagnosis not present

## 2024-04-06 DIAGNOSIS — J302 Other seasonal allergic rhinitis: Secondary | ICD-10-CM | POA: Diagnosis not present

## 2024-05-12 DIAGNOSIS — Z114 Encounter for screening for human immunodeficiency virus [HIV]: Secondary | ICD-10-CM | POA: Diagnosis not present

## 2024-05-12 DIAGNOSIS — Z113 Encounter for screening for infections with a predominantly sexual mode of transmission: Secondary | ICD-10-CM | POA: Diagnosis not present

## 2024-06-18 DIAGNOSIS — J029 Acute pharyngitis, unspecified: Secondary | ICD-10-CM | POA: Diagnosis not present

## 2024-07-27 DIAGNOSIS — Z113 Encounter for screening for infections with a predominantly sexual mode of transmission: Secondary | ICD-10-CM | POA: Diagnosis not present

## 2024-07-28 DIAGNOSIS — Z113 Encounter for screening for infections with a predominantly sexual mode of transmission: Secondary | ICD-10-CM | POA: Diagnosis not present

## 2024-08-12 DIAGNOSIS — F339 Major depressive disorder, recurrent, unspecified: Secondary | ICD-10-CM | POA: Diagnosis not present

## 2024-08-13 DIAGNOSIS — F339 Major depressive disorder, recurrent, unspecified: Secondary | ICD-10-CM | POA: Diagnosis not present

## 2024-08-18 DIAGNOSIS — F339 Major depressive disorder, recurrent, unspecified: Secondary | ICD-10-CM | POA: Diagnosis not present

## 2024-08-19 DIAGNOSIS — F339 Major depressive disorder, recurrent, unspecified: Secondary | ICD-10-CM | POA: Diagnosis not present

## 2024-08-22 ENCOUNTER — Encounter

## 2024-08-22 ENCOUNTER — Telehealth: Admitting: Family

## 2024-08-22 DIAGNOSIS — N760 Acute vaginitis: Secondary | ICD-10-CM | POA: Diagnosis not present

## 2024-08-22 DIAGNOSIS — B9689 Other specified bacterial agents as the cause of diseases classified elsewhere: Secondary | ICD-10-CM

## 2024-08-22 MED ORDER — METRONIDAZOLE 500 MG PO TABS: 500.0000 mg | ORAL_TABLET | Freq: Two times a day (BID) | ORAL | 0 refills | Status: AC

## 2024-08-22 NOTE — Patient Instructions (Signed)

## 2024-08-22 NOTE — Progress Notes (Signed)
 Virtual Visit Consent   Susan Moore, you are scheduled for a virtual visit with a [facility] provider today. Just as with appointments in the office, your consent must be obtained to participate. Your consent will be active for this visit and any virtual visit you may have with one of our providers in the next 365 days. If you have a MyChart account, a copy of this consent can be sent to you electronically.  As this is a virtual visit, video technology does not allow for your provider to perform a traditional examination. This may limit your provider's ability to fully assess your condition. If your provider identifies any concerns that need to be evaluated in person or the need to arrange testing (such as labs, EKG, etc.), we will make arrangements to do so. Although advances in technology are sophisticated, we cannot ensure that it will always work on either your end or our end. If the connection with a video visit is poor, the visit may have to be switched to a telephone visit. With either a video or telephone visit, we are not always able to ensure that we have a secure connection.  By engaging in this virtual visit, you consent to the provision of healthcare and authorize for your insurance to be billed (if applicable) for the services provided during this visit. Depending on your insurance coverage, you may receive a charge related to this service.  I need to obtain your verbal consent now. Are you willing to proceed with your visit today? Susan Moore has provided verbal consent on 08/22/2024 for a virtual visit (video or telephone). Bari Learn, FNP  Date: 08/22/2024 4:33 PM   Virtual Visit via Video Note   I, Bari Learn, connected with  Susan Moore  ([id number], 2001/10/24) on 08/22/24 at  4:30 PM EDT by a video-enabled telemedicine application and verified that I am speaking with the correct person using two identifiers.  Location: Patient: Virtual Visit Location Patient:  Home Provider: Virtual Visit Location Provider: Home Office   I discussed the limitations of evaluation and management by telemedicine and the availability of in person appointments. The patient expressed understanding and agreed to proceed.    History of Present Illness: Susan Moore is a 23 y.o. who identifies as a female who was assigned female at birth, and is being seen today for vaginal discharge that started a few days ago.  HPI: Vaginal Discharge The patient's primary symptoms include a genital odor and vaginal discharge. The patient's pertinent negatives include no genital itching or vaginal bleeding. This is a new problem. The current episode started in the past 7 days. Pertinent negatives include no anorexia, chills, constipation, diarrhea, dysuria, fever, flank pain, frequency, headaches, hematuria, nausea or urgency. The vaginal discharge was white.    Problems:  Patient Active Problem List   Diagnosis Date Noted   Acute mastitis 07/12/2020   Asthma     Allergies: No Known Allergies Medications:  Current Outpatient Medications:    metroNIDAZOLE  (FLAGYL ) 500 MG tablet, Take 1 tablet (500 mg total) by mouth 2 (two) times daily for 7 days., Disp: 14 tablet, Rfl: 0   albuterol  (PROVENTIL ) (2.5 MG/3ML) 0.083% nebulizer solution, Take 3 mLs (2.5 mg total) by nebulization every 6 (six) hours as needed for wheezing., Disp: 75 mL, Rfl: 0   PROAIR  HFA 108 (90 Base) MCG/ACT inhaler, INHALE 1 TO 2 PUFFS INTO THE LUNGS EVERY 6 HOURS AS NEEDED FOR WHEEZING OR SHORTNESS OF BREATH, Disp: 8.5 g, Rfl: 1  Observations/Objective: Patient is well-developed, well-nourished in no acute distress.  Resting comfortably  at home.  Head is normocephalic, atraumatic.  No labored breathing.  Speech is clear and coherent with logical content.  Patient is alert and oriented at baseline.    Assessment and Plan: 1. BV (bacterial vaginosis) (Primary) - metroNIDAZOLE  (FLAGYL ) 500 MG tablet; Take 1  tablet (500 mg total) by mouth 2 (two) times daily for 7 days.  Dispense: 14 tablet; Refill: 0  Keep clean and dry Start flagyl   Avoid sex until treatment is complete Avoid tight clothing Follow up if symptoms worsen or do not improve   Follow Up Instructions: I discussed the assessment and treatment plan with the patient. The patient was provided an opportunity to ask questions and all were answered. The patient agreed with the plan and demonstrated an understanding of the instructions.  A copy of instructions were sent to the patient via MyChart unless otherwise noted below.     The patient was advised to call back or seek an in-person evaluation if the symptoms worsen or if the condition fails to improve as anticipated.    Bari Learn, FNP

## 2024-08-24 DIAGNOSIS — F339 Major depressive disorder, recurrent, unspecified: Secondary | ICD-10-CM | POA: Diagnosis not present

## 2024-08-25 DIAGNOSIS — F339 Major depressive disorder, recurrent, unspecified: Secondary | ICD-10-CM | POA: Diagnosis not present

## 2024-08-27 DIAGNOSIS — F339 Major depressive disorder, recurrent, unspecified: Secondary | ICD-10-CM | POA: Diagnosis not present

## 2024-08-30 DIAGNOSIS — F339 Major depressive disorder, recurrent, unspecified: Secondary | ICD-10-CM | POA: Diagnosis not present

## 2024-09-01 DIAGNOSIS — F339 Major depressive disorder, recurrent, unspecified: Secondary | ICD-10-CM | POA: Diagnosis not present

## 2024-09-07 DIAGNOSIS — F339 Major depressive disorder, recurrent, unspecified: Secondary | ICD-10-CM | POA: Diagnosis not present

## 2024-09-08 DIAGNOSIS — F339 Major depressive disorder, recurrent, unspecified: Secondary | ICD-10-CM | POA: Diagnosis not present

## 2024-09-13 DIAGNOSIS — F339 Major depressive disorder, recurrent, unspecified: Secondary | ICD-10-CM | POA: Diagnosis not present

## 2024-09-16 DIAGNOSIS — F339 Major depressive disorder, recurrent, unspecified: Secondary | ICD-10-CM | POA: Diagnosis not present

## 2024-09-20 DIAGNOSIS — F339 Major depressive disorder, recurrent, unspecified: Secondary | ICD-10-CM | POA: Diagnosis not present

## 2024-09-23 DIAGNOSIS — F339 Major depressive disorder, recurrent, unspecified: Secondary | ICD-10-CM | POA: Diagnosis not present

## 2024-09-27 DIAGNOSIS — F339 Major depressive disorder, recurrent, unspecified: Secondary | ICD-10-CM | POA: Diagnosis not present

## 2024-09-29 DIAGNOSIS — F339 Major depressive disorder, recurrent, unspecified: Secondary | ICD-10-CM | POA: Diagnosis not present

## 2024-10-04 DIAGNOSIS — F339 Major depressive disorder, recurrent, unspecified: Secondary | ICD-10-CM | POA: Diagnosis not present

## 2024-10-06 DIAGNOSIS — F339 Major depressive disorder, recurrent, unspecified: Secondary | ICD-10-CM | POA: Diagnosis not present

## 2024-10-12 DIAGNOSIS — F339 Major depressive disorder, recurrent, unspecified: Secondary | ICD-10-CM | POA: Diagnosis not present

## 2024-10-13 DIAGNOSIS — F339 Major depressive disorder, recurrent, unspecified: Secondary | ICD-10-CM | POA: Diagnosis not present

## 2024-10-20 DIAGNOSIS — F339 Major depressive disorder, recurrent, unspecified: Secondary | ICD-10-CM | POA: Diagnosis not present

## 2024-10-22 DIAGNOSIS — F339 Major depressive disorder, recurrent, unspecified: Secondary | ICD-10-CM | POA: Diagnosis not present

## 2024-10-26 DIAGNOSIS — F339 Major depressive disorder, recurrent, unspecified: Secondary | ICD-10-CM | POA: Diagnosis not present

## 2024-10-28 DIAGNOSIS — F339 Major depressive disorder, recurrent, unspecified: Secondary | ICD-10-CM | POA: Diagnosis not present
